# Patient Record
Sex: Female | Born: 1990 | Race: White | Hispanic: No | Marital: Single | Smoking: Current every day smoker
Health system: Southern US, Community
[De-identification: ages and names within clinical notes are randomized; demographics above are authoritative.]

## PROBLEM LIST (undated history)

## (undated) ENCOUNTER — Inpatient Hospital Stay (HOSPITAL_COMMUNITY): Payer: Self-pay

## (undated) ENCOUNTER — Emergency Department (HOSPITAL_COMMUNITY): Payer: Medicaid Other

## (undated) DIAGNOSIS — N200 Calculus of kidney: Secondary | ICD-10-CM

## (undated) DIAGNOSIS — R51 Headache: Secondary | ICD-10-CM

## (undated) DIAGNOSIS — D649 Anemia, unspecified: Secondary | ICD-10-CM

## (undated) DIAGNOSIS — R002 Palpitations: Secondary | ICD-10-CM

## (undated) DIAGNOSIS — I10 Essential (primary) hypertension: Secondary | ICD-10-CM

## (undated) HISTORY — PX: WISDOM TOOTH EXTRACTION: SHX21

---
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
## 2008-03-07 ENCOUNTER — Emergency Department (HOSPITAL_COMMUNITY): Admission: EM | Admit: 2008-03-07 | Discharge: 2008-03-07 | Payer: Self-pay | Admitting: Emergency Medicine

## 2008-07-07 ENCOUNTER — Emergency Department (HOSPITAL_COMMUNITY): Admission: EM | Admit: 2008-07-07 | Discharge: 2008-07-07 | Payer: Self-pay | Admitting: Emergency Medicine

## 2008-09-20 ENCOUNTER — Emergency Department (HOSPITAL_COMMUNITY): Admission: EM | Admit: 2008-09-20 | Discharge: 2008-09-20 | Payer: Self-pay | Admitting: Family Medicine

## 2008-10-18 ENCOUNTER — Emergency Department (HOSPITAL_COMMUNITY): Admission: EM | Admit: 2008-10-18 | Discharge: 2008-10-18 | Payer: Self-pay | Admitting: Emergency Medicine

## 2009-02-02 ENCOUNTER — Emergency Department (HOSPITAL_COMMUNITY): Admission: EM | Admit: 2009-02-02 | Discharge: 2009-02-02 | Payer: Self-pay | Admitting: Family Medicine

## 2009-03-16 ENCOUNTER — Emergency Department (HOSPITAL_COMMUNITY): Admission: EM | Admit: 2009-03-16 | Discharge: 2009-03-16 | Payer: Self-pay | Admitting: Emergency Medicine

## 2009-03-21 ENCOUNTER — Emergency Department (HOSPITAL_COMMUNITY): Admission: EM | Admit: 2009-03-21 | Discharge: 2009-03-21 | Payer: Self-pay | Admitting: Emergency Medicine

## 2009-05-27 DIAGNOSIS — D649 Anemia, unspecified: Secondary | ICD-10-CM

## 2009-05-27 HISTORY — DX: Anemia, unspecified: D64.9

## 2009-11-19 ENCOUNTER — Inpatient Hospital Stay (HOSPITAL_COMMUNITY): Admission: AD | Admit: 2009-11-19 | Discharge: 2009-11-19 | Payer: Self-pay | Admitting: Obstetrics and Gynecology

## 2009-12-26 ENCOUNTER — Inpatient Hospital Stay (HOSPITAL_COMMUNITY): Admission: AD | Admit: 2009-12-26 | Discharge: 2009-12-26 | Payer: Self-pay | Admitting: Obstetrics and Gynecology

## 2010-01-07 ENCOUNTER — Inpatient Hospital Stay (HOSPITAL_COMMUNITY): Admission: AD | Admit: 2010-01-07 | Discharge: 2010-01-07 | Payer: Self-pay | Admitting: Obstetrics and Gynecology

## 2010-01-12 ENCOUNTER — Inpatient Hospital Stay (HOSPITAL_COMMUNITY): Admission: AD | Admit: 2010-01-12 | Discharge: 2010-01-16 | Payer: Self-pay | Admitting: Obstetrics and Gynecology

## 2010-01-17 ENCOUNTER — Inpatient Hospital Stay (HOSPITAL_COMMUNITY): Admission: AD | Admit: 2010-01-17 | Discharge: 2010-01-17 | Payer: Self-pay | Admitting: Obstetrics and Gynecology

## 2010-08-10 LAB — CBC
HCT: 20.3 % — ABNORMAL LOW (ref 36.0–46.0)
MCH: 29.7 pg (ref 26.0–34.0)
MCH: 31.3 pg (ref 26.0–34.0)
MCHC: 32.9 g/dL (ref 30.0–36.0)
MCHC: 35.3 g/dL (ref 30.0–36.0)
MCV: 90.1 fL (ref 78.0–100.0)
Platelets: 256 10*3/uL (ref 150–400)
RDW: 14.6 % (ref 11.5–15.5)

## 2010-08-10 LAB — RPR: RPR Ser Ql: NONREACTIVE

## 2010-08-10 LAB — RH IMMUNE GLOB WKUP(>/=20WKS)(NOT WOMEN'S HOSP)
Antibody Screen: NEGATIVE
Fetal Screen: NEGATIVE

## 2010-08-30 LAB — URINE MICROSCOPIC-ADD ON

## 2010-08-30 LAB — URINALYSIS, ROUTINE W REFLEX MICROSCOPIC
Bilirubin Urine: NEGATIVE
Glucose, UA: NEGATIVE mg/dL
Hgb urine dipstick: NEGATIVE
Ketones, ur: NEGATIVE mg/dL
Nitrite: NEGATIVE
Nitrite: NEGATIVE
Specific Gravity, Urine: 1.024 (ref 1.005–1.030)
Specific Gravity, Urine: 1.025 (ref 1.005–1.030)
Urobilinogen, UA: 0.2 mg/dL (ref 0.0–1.0)
pH: 5.5 (ref 5.0–8.0)
pH: 8 (ref 5.0–8.0)

## 2010-08-30 LAB — POCT PREGNANCY, URINE
Preg Test, Ur: NEGATIVE
Preg Test, Ur: NEGATIVE

## 2010-09-05 LAB — POCT URINALYSIS DIP (DEVICE)
Ketones, ur: NEGATIVE mg/dL
Protein, ur: NEGATIVE mg/dL
Urobilinogen, UA: 0.2 mg/dL (ref 0.0–1.0)
pH: 6.5 (ref 5.0–8.0)

## 2010-09-05 LAB — POCT PREGNANCY, URINE: Preg Test, Ur: NEGATIVE

## 2010-09-11 LAB — URINALYSIS, ROUTINE W REFLEX MICROSCOPIC
Bilirubin Urine: NEGATIVE
Ketones, ur: NEGATIVE mg/dL
Nitrite: NEGATIVE
Protein, ur: NEGATIVE mg/dL
pH: 7 (ref 5.0–8.0)

## 2010-09-11 LAB — URINE MICROSCOPIC-ADD ON

## 2010-09-11 LAB — POCT PREGNANCY, URINE: Preg Test, Ur: NEGATIVE

## 2011-01-23 ENCOUNTER — Inpatient Hospital Stay (INDEPENDENT_AMBULATORY_CARE_PROVIDER_SITE_OTHER)
Admission: RE | Admit: 2011-01-23 | Discharge: 2011-01-23 | Disposition: A | Discharge status: Home / Self Care | Payer: Self-pay | Source: Ambulatory Visit | Attending: Family Medicine | Admitting: Family Medicine

## 2011-01-23 DIAGNOSIS — J069 Acute upper respiratory infection, unspecified: Secondary | ICD-10-CM

## 2011-02-21 LAB — DIFFERENTIAL
Basophils Absolute: 0
Eosinophils Absolute: 0.1
Eosinophils Relative: 1
Lymphs Abs: 2.2
Neutrophils Relative %: 68

## 2011-02-21 LAB — COMPREHENSIVE METABOLIC PANEL
ALT: 21
AST: 16
CO2: 21
Calcium: 8.4
Chloride: 112
Creatinine, Ser: 0.71
GFR calc Af Amer: >60
GFR calc non Af Amer: >60
Glucose, Bld: 116 — ABNORMAL HIGH
Sodium: 141
Total Bilirubin: 0.5

## 2011-02-21 LAB — CBC
Hemoglobin: 12.4
MCHC: 34.7
MCV: 85.6
RBC: 4.17
WBC: 9.1

## 2011-02-21 LAB — URINE CULTURE: Colony Count: 4000

## 2011-02-21 LAB — URINALYSIS, ROUTINE W REFLEX MICROSCOPIC
Bilirubin Urine: NEGATIVE
Ketones, ur: NEGATIVE
Nitrite: NEGATIVE
Protein, ur: NEGATIVE
Urobilinogen, UA: 0.2

## 2011-02-21 LAB — LIPASE, BLOOD: Lipase: 16

## 2011-02-21 LAB — POCT PREGNANCY, URINE
Operator id: 151321
Preg Test, Ur: NEGATIVE

## 2011-02-25 LAB — POCT URINALYSIS DIP (DEVICE)
Bilirubin Urine: NEGATIVE
Glucose, UA: NEGATIVE
Nitrite: NEGATIVE
Operator id: 235561
Urobilinogen, UA: 0.2

## 2011-02-25 LAB — POCT PREGNANCY, URINE: Preg Test, Ur: NEGATIVE

## 2011-02-25 LAB — URINE CULTURE

## 2011-03-04 LAB — URINE MICROSCOPIC-ADD ON

## 2011-03-04 LAB — URINALYSIS, ROUTINE W REFLEX MICROSCOPIC
Glucose, UA: NEGATIVE
Glucose, UA: NEGATIVE
Ketones, ur: NEGATIVE
Protein, ur: NEGATIVE
Protein, ur: NEGATIVE

## 2011-03-04 LAB — DIFFERENTIAL
Basophils Absolute: 0.1
Basophils Relative: 1
Eosinophils Absolute: 0 — ABNORMAL LOW
Eosinophils Absolute: 0.1 — ABNORMAL LOW
Lymphs Abs: 0.6 — ABNORMAL LOW
Monocytes Absolute: 1.1 — ABNORMAL HIGH
Monocytes Relative: 3
Monocytes Relative: 7
Neutrophils Relative %: 69
Neutrophils Relative %: 91 — ABNORMAL HIGH

## 2011-03-04 LAB — BASIC METABOLIC PANEL
CO2: 23
Chloride: 106
Chloride: 107
Creatinine, Ser: 0.62
GFR calc Af Amer: >60
GFR calc Af Amer: >60
Glucose, Bld: 94
Potassium: 3.6
Sodium: 137

## 2011-03-04 LAB — CBC
HCT: 37.7
Hemoglobin: 13.6
Hemoglobin: 14.7
MCHC: 35
MCV: 86.1
MCV: 86.4
RBC: 4.38
RBC: 4.85
RDW: 11.8
WBC: 10.6 — ABNORMAL HIGH

## 2011-12-21 ENCOUNTER — Emergency Department (HOSPITAL_COMMUNITY)
Admission: EM | Admit: 2011-12-21 | Discharge: 2011-12-21 | Disposition: A | Discharge status: Home / Self Care | Payer: Medicaid Other | Attending: Emergency Medicine | Admitting: Emergency Medicine

## 2011-12-21 ENCOUNTER — Encounter (HOSPITAL_COMMUNITY): Payer: Self-pay | Admitting: Emergency Medicine

## 2011-12-21 DIAGNOSIS — K0889 Other specified disorders of teeth and supporting structures: Secondary | ICD-10-CM

## 2011-12-21 DIAGNOSIS — K089 Disorder of teeth and supporting structures, unspecified: Secondary | ICD-10-CM | POA: Insufficient documentation

## 2011-12-21 MED ORDER — PENICILLIN V POTASSIUM 500 MG PO TABS: 500.0000 mg | ORAL_TABLET | Freq: Four times a day (QID) | ORAL | Status: AC

## 2011-12-21 NOTE — ED Provider Notes (Signed)
History     CSN: [id number]  Arrival date & time 12/21/11  1514   First MD Initiated Contact with Patient 12/21/11 1537      Chief Complaint  Patient presents with  . Dental Pain    (Consider location/radiation/quality/duration/timing/severity/associated sxs/prior treatment) Patient is a 21 y.o. female presenting with tooth pain. The history is provided by the patient.  Dental Pain G2 P1 7wks preg healthy F presents with dental pain worsening over the last 2 days. Chronically poor dentition, with recurrence of pain that she has experienced in the past to left upper dentition with rad to jaw. Pain constant, gradually worsening. Denies HA, ear pain, tinnitus, facial swelling, trouble swallowing, neck pain/swelling. No CP or SOB. Worse with chewing, opening mouth. No alleviating factors. Has taken nothing for the pain as she was unsure what would be safe while pregnant.  History reviewed. No pertinent past medical history.  Past Surgical History  Procedure Date  . Cesarean section     No family history on file.  History  Substance Use Topics  . Smoking status: Current Everyday Smoker -- 0.5 packs/day  . Smokeless tobacco: Never Used  . Alcohol Use: No     Review of Systems Pertinent positives and negatives are reviewed in the HPI.  Allergies  Review of patient's allergies indicates not on file.  Home Medications  No current outpatient prescriptions on file.  BP 135/72  Pulse 80  Temp 98.6 F (37 C) (Oral)  SpO2 99%  LMP 10/22/2011  Physical Exam  Nursing note reviewed. Constitutional: She is oriented to person, place, and time. She appears well-developed and well-nourished. No distress.       Vital signs are reviewed and are normal. Specifically, pt is afebrile.  HENT:  Head: Normocephalic and atraumatic. No trismus in the jaw.  Right Ear: External ear normal.  Left Ear: External ear normal.  Nose: Nose normal.  Mouth/Throat: Uvula is midline, oropharynx is  clear and moist and mucous membranes are normal. Dental caries present. No uvula swelling. No oropharyngeal exudate or tonsillar abscesses.         No facial swelling appreciated. No TTP floor of mouth. Normal spoken voice.   Eyes: Conjunctivae are normal. Pupils are equal, round, and reactive to light.  Neck: Neck supple.  Cardiovascular: Normal rate and regular rhythm.   Pulmonary/Chest: Effort normal. No respiratory distress.  Musculoskeletal: She exhibits no edema.  Lymphadenopathy:    She has no cervical adenopathy.  Neurological: She is alert and oriented to person, place, and time.  Skin: Skin is warm and dry.  Psychiatric: She has a normal mood and affect.    ED Course  Procedures (including critical care time)  Labs Reviewed - No data to display No results found.   1. Pain, dental       MDM  Dental pain, pregnant. Afebrile, non-toxic appearing. No abscess visible for ED drainage. No sign of ludwig angina, retropharyngeal abscess, peritonsillar abscess. Will start on PCN and advise acetaminophen for pain. She has plans to see a dentist on Monday.        Shaaron Adler, [place] 12/21/11 1610

## 2011-12-21 NOTE — ED Notes (Signed)
Dental pain left upper back tooth radiating down to lower jaw as well. Pain has escalated past 2 days.

## 2011-12-22 NOTE — ED Provider Notes (Signed)
Medical screening examination/treatment/procedure(s) were performed by non-physician practitioner and as supervising physician I was immediately available for consultation/collaboration.  Derwood Kaplan, MD 12/22/11 3086

## 2012-01-02 LAB — OB RESULTS CONSOLE ABO/RH: RH Type: NEGATIVE

## 2012-07-01 ENCOUNTER — Encounter (HOSPITAL_COMMUNITY): Payer: Self-pay

## 2012-07-01 ENCOUNTER — Inpatient Hospital Stay (HOSPITAL_COMMUNITY)
Admission: AD | Admit: 2012-07-01 | Discharge: 2012-07-01 | Disposition: A | Discharge status: Home / Self Care | Payer: Medicaid Other | Source: Ambulatory Visit | Attending: Obstetrics and Gynecology | Admitting: Obstetrics and Gynecology

## 2012-07-01 DIAGNOSIS — R42 Dizziness and giddiness: Secondary | ICD-10-CM | POA: Insufficient documentation

## 2012-07-01 DIAGNOSIS — O99891 Other specified diseases and conditions complicating pregnancy: Secondary | ICD-10-CM | POA: Insufficient documentation

## 2012-07-01 DIAGNOSIS — O219 Vomiting of pregnancy, unspecified: Secondary | ICD-10-CM

## 2012-07-01 DIAGNOSIS — O212 Late vomiting of pregnancy: Secondary | ICD-10-CM | POA: Insufficient documentation

## 2012-07-01 DIAGNOSIS — R197 Diarrhea, unspecified: Secondary | ICD-10-CM

## 2012-07-01 HISTORY — DX: Headache: R51

## 2012-07-01 HISTORY — DX: Anemia, unspecified: D64.9

## 2012-07-01 LAB — URINALYSIS, ROUTINE W REFLEX MICROSCOPIC
Bilirubin Urine: NEGATIVE
Glucose, UA: NEGATIVE mg/dL
Hgb urine dipstick: NEGATIVE
Ketones, ur: NEGATIVE mg/dL
Leukocytes, UA: NEGATIVE
Nitrite: NEGATIVE
Protein, ur: NEGATIVE mg/dL
Specific Gravity, Urine: <1.005 — ABNORMAL LOW (ref 1.005–1.030)
Urobilinogen, UA: 0.2 mg/dL (ref 0.0–1.0)
pH: 7 (ref 5.0–8.0)

## 2012-07-01 LAB — COMPREHENSIVE METABOLIC PANEL
Albumin: 3 g/dL — ABNORMAL LOW (ref 3.5–5.2)
Alkaline Phosphatase: 105 U/L (ref 39–117)
BUN: 5 mg/dL — ABNORMAL LOW (ref 6–23)
CO2: 23 mEq/L (ref 19–32)
Chloride: 100 mEq/L (ref 96–112)
Creatinine, Ser: 0.48 mg/dL — ABNORMAL LOW (ref 0.50–1.10)
GFR calc Af Amer: >90 mL/min (ref >90)
GFR calc non Af Amer: >90 mL/min (ref >90)
Glucose, Bld: 74 mg/dL (ref 70–99)
Potassium: 4 mEq/L (ref 3.5–5.1)
Total Bilirubin: 0.1 mg/dL — ABNORMAL LOW (ref 0.3–1.2)

## 2012-07-01 LAB — CBC
HCT: 31.3 % — ABNORMAL LOW (ref 36.0–46.0)
Hemoglobin: 11 g/dL — ABNORMAL LOW (ref 12.0–15.0)
MCV: 85.1 fL (ref 78.0–100.0)
RBC: 3.68 MIL/uL — ABNORMAL LOW (ref 3.87–5.11)
WBC: 13.5 10*3/uL — ABNORMAL HIGH (ref 4.0–10.5)

## 2012-07-01 MED ORDER — ONDANSETRON HCL 4 MG/2ML IJ SOLN: 4.0000 mg | INTRAMUSCULAR | Status: DC

## 2012-07-01 MED ORDER — LACTATED RINGERS IV BOLUS (SEPSIS): 1000.0000 mL | Freq: Once | INTRAVENOUS | Status: DC

## 2012-07-01 NOTE — MAU Note (Signed)
Pt states here for dizziness since early this am, last pm, denies bleeding or lof. +FM, denies uti s/s.

## 2012-07-01 NOTE — MAU Provider Note (Signed)
Chief Complaint:  Emesis, Dizziness, Diarrhea and Anxiety   First Provider Initiated Contact with Patient 07/01/12 1050      HPI: Dorothy Schwartz is a 22 y.o. G2P1001 at 41w3dwho presents to maternity admissions reporting episode this morning of n/v/diarrhea and dizziness.  Pt denies n/v/diarrhea upon arrival to MAU but reports some mild lightheadedness continues. She denies known anemia in this pregnancy but reports she was anemic in her last pregnancy.  She has not eaten anything yet today. She reports good fetal movement, denies cramping/contractions, LOF, vaginal bleeding, vaginal itching/burning, urinary symptoms, h/a, or fever/chills.  .   Past Medical History: Past Medical History  Diagnosis Date  . Anemia   . Headache     Past obstetric history: OB History    Grav Para Term Preterm Abortions TAB SAB Ect Mult Living   2 1 1       1      # Outc Date GA Lbr Len/2nd Wgt Sex Del Anes PTL Lv   1 TRM            2 CUR               Past Surgical History: Past Surgical History  Procedure Date  . Cesarean section   . Wisdom tooth extraction     Family History: Family History  Problem Relation Age of Onset  . Other Neg Hx     Social History: History  Substance Use Topics  . Smoking status: Current Every Day Smoker -- 0.5 packs/day  . Smokeless tobacco: Never Used  . Alcohol Use: No    Allergies:  Allergies  Allergen Reactions  . Morphine And Related Shortness Of Breath  . Nsaids Anaphylaxis    Kidney stones   . Percocet (Oxycodone-Acetaminophen) Hives    Meds:  Prescriptions prior to admission  Medication Sig Dispense Refill  . cyclobenzaprine (FLEXERIL) 10 MG tablet Take 10 mg by mouth 3 (three) times daily as needed. Muscle spasms      . Prenatal Vit-Fe Fumarate-FA (PRENATAL MULTIVITAMIN) TABS Take 1 tablet by mouth daily.        ROS: Pertinent findings in history of present illness.  Physical Exam  Blood pressure 125/70, pulse 89, temperature 98.2 F  (36.8 C), temperature source Oral, resp. rate 18, height 5' 1.5" (1.562 m), weight 84.369 kg (186 lb), last menstrual period 10/22/2011, SpO2 99.00%. GENERAL: Well-developed, well-nourished female in no acute distress.  HEENT: normocephalic HEART: normal rate RESP: normal effort ABDOMEN: Soft, non-tender, gravid appropriate for gestational age EXTREMITIES: Nontender, no edema NEURO: alert and oriented SPECULUM EXAM: Deferred  Dilation: Closed Effacement (%): Thick Cervical Position: Posterior Station: -3 Exam by:: LLeftwich-Kirby, CNM  FHT:  Baseline 120 , moderate variability, accelerations present, no decelerations Contractions: occasional, mild to palpation   Labs:  Recent Results (from the past 168 hour(s))  URINALYSIS, ROUTINE W REFLEX MICROSCOPIC   Collection Time    07/01/12  9:55 AM      Result Value Range   Color, Urine YELLOW  YELLOW   APPearance CLEAR  CLEAR   Specific Gravity, Urine <1.005 (*) 1.005 - 1.030   pH 7.0  5.0 - 8.0   Glucose, UA NEGATIVE  NEGATIVE mg/dL   Hgb urine dipstick NEGATIVE  NEGATIVE   Bilirubin Urine NEGATIVE  NEGATIVE   Ketones, ur NEGATIVE  NEGATIVE mg/dL   Protein, ur NEGATIVE  NEGATIVE mg/dL   Urobilinogen, UA 0.2  0.0 - 1.0 mg/dL   Nitrite NEGATIVE  NEGATIVE  Leukocytes, UA NEGATIVE  NEGATIVE  CBC   Collection Time    07/01/12 11:13 AM      Result Value Range   WBC 13.5 (*) 4.0 - 10.5 K/uL   RBC 3.68 (*) 3.87 - 5.11 MIL/uL   Hemoglobin 11.0 (*) 12.0 - 15.0 g/dL   HCT 16.1 (*) 09.6 - 04.5 %   MCV 85.1  78.0 - 100.0 fL   MCH 29.9  26.0 - 34.0 pg   MCHC 35.1  30.0 - 36.0 g/dL   RDW 40.9  81.1 - 91.4 %   Platelets 264  150 - 400 K/uL  COMPREHENSIVE METABOLIC PANEL   Collection Time    07/01/12 11:13 AM      Result Value Range   Sodium 134 (*) 135 - 145 mEq/L   Potassium 4.0  3.5 - 5.1 mEq/L   Chloride 100  96 - 112 mEq/L   CO2 23  19 - 32 mEq/L   Glucose, Bld 74  70 - 99 mg/dL   BUN 5 (*) 6 - 23 mg/dL   Creatinine, Ser  7.82 (*) 0.50 - 1.10 mg/dL   Calcium 9.3  8.4 - 95.6 mg/dL   Total Protein 6.6  6.0 - 8.3 g/dL   Albumin 3.0 (*) 3.5 - 5.2 g/dL   AST 15  0 - 37 U/L   ALT 10  0 - 35 U/L   Alkaline Phosphatase 105  39 - 117 U/L   Total Bilirubin 0.1 (*) 0.3 - 1.2 mg/dL   GFR calc non Af Amer >90  >90 mL/min   GFR calc Af Amer >90  >90 mL/min     Assessment: 1. Dizziness   2. Diarrhea   3. Nausea/vomiting in pregnancy     Plan: Called Dr Marcelle Overlie to review assessment and findings Discharge home Eat regular meals and drink plenty of fluid Eat iron-rich foods and take PNV daily F/U with prenatal provider Return to MAU as needed     Medication List     As of 07/01/2012 11:06 AM    ASK your doctor about these medications         cyclobenzaprine 10 MG tablet   Commonly known as: FLEXERIL   Take 10 mg by mouth 3 (three) times daily as needed. Muscle spasms      prenatal multivitamin Tabs   Take 1 tablet by mouth daily.        Sharen Counter Certified Nurse-Midwife 07/01/2012 11:06 AM

## 2012-07-01 NOTE — MAU Note (Signed)
Woke up during the night went to the restoom, was dizzy.  When woke up this morning was still feeling dizzy. Vomiting (1) started this morning.  Diarrhea (4) also started this morning. No one else at home is sick. Hands are clammy, is very anxious.

## 2012-07-16 ENCOUNTER — Inpatient Hospital Stay (HOSPITAL_COMMUNITY)
Admission: AD | Admit: 2012-07-16 | Discharge: 2012-07-16 | Disposition: A | Discharge status: Home / Self Care | Payer: Medicaid Other | Source: Ambulatory Visit | Attending: Obstetrics and Gynecology | Admitting: Obstetrics and Gynecology

## 2012-07-16 ENCOUNTER — Inpatient Hospital Stay (HOSPITAL_COMMUNITY): Payer: Medicaid Other

## 2012-07-16 ENCOUNTER — Encounter (HOSPITAL_COMMUNITY): Payer: Self-pay | Admitting: *Deleted

## 2012-07-16 DIAGNOSIS — N390 Urinary tract infection, site not specified: Secondary | ICD-10-CM | POA: Insufficient documentation

## 2012-07-16 DIAGNOSIS — R109 Unspecified abdominal pain: Secondary | ICD-10-CM | POA: Insufficient documentation

## 2012-07-16 DIAGNOSIS — O2390 Unspecified genitourinary tract infection in pregnancy, unspecified trimester: Secondary | ICD-10-CM | POA: Insufficient documentation

## 2012-07-16 DIAGNOSIS — O2340 Unspecified infection of urinary tract in pregnancy, unspecified trimester: Secondary | ICD-10-CM

## 2012-07-16 LAB — URINALYSIS, ROUTINE W REFLEX MICROSCOPIC
Glucose, UA: NEGATIVE mg/dL
Ketones, ur: NEGATIVE mg/dL
Protein, ur: NEGATIVE mg/dL
Urobilinogen, UA: 0.2 mg/dL (ref 0.0–1.0)

## 2012-07-16 LAB — URINE MICROSCOPIC-ADD ON

## 2012-07-16 MED ORDER — GI COCKTAIL ~~LOC~~: 30.0000 mL | Freq: Once | ORAL | Status: DC

## 2012-07-16 MED ORDER — CEPHALEXIN 500 MG PO CAPS
500.0000 mg | ORAL_CAPSULE | Freq: Once | ORAL | Status: AC
  Administered 2012-07-16: 500 mg via ORAL
  Filled 2012-07-16: qty 1

## 2012-07-16 MED ORDER — CEPHALEXIN 500 MG PO CAPS: 500.0000 mg | ORAL_CAPSULE | Freq: Four times a day (QID) | ORAL | Status: DC

## 2012-07-16 NOTE — MAU Note (Signed)
Mayer Camel NP at the bedside.

## 2012-07-16 NOTE — MAU Note (Signed)
Pt reports " a burning sensation" in upper mid abd. States it has been there all day today.

## 2012-07-16 NOTE — MAU Provider Note (Signed)
History     CSN: [id number]  Arrival date and time: 07/16/12 2016   First Provider Initiated Contact with Patient 07/16/12 2158      Chief Complaint  Patient presents with  . Abdominal Pain   HPI Dorothy Schwartz is a 22 y.o. female @ [redacted]w[redacted]d gestation who presents to MAU with abdominal pain. The pain is located in the upper abdomen. She describes the pain as a burning that comes and goes. She has felt some contractions but not strong or painful. She denies nausea, vomiting or other problems. The history was provided by the patient.  OB History   Grav Para Term Preterm Abortions TAB SAB Ect Mult Living   2 1 1       1       Past Medical History  Diagnosis Date  . Anemia   . Headache     Past Surgical History  Procedure Laterality Date  . Cesarean section    . Wisdom tooth extraction      Family History  Problem Relation Age of Onset  . Other Neg Hx     History  Substance Use Topics  . Smoking status: Current Every Day Smoker -- 0.50 packs/day  . Smokeless tobacco: Never Used  . Alcohol Use: No    Allergies:  Allergies  Allergen Reactions  . Morphine And Related Shortness Of Breath  . Percocet (Oxycodone-Acetaminophen) Hives    Prescriptions prior to admission  Medication Sig Dispense Refill  . calcium carbonate (TUMS - DOSED IN MG ELEMENTAL CALCIUM) 500 MG chewable tablet Chew 2 tablets by mouth daily as needed for heartburn.      . cyclobenzaprine (FLEXERIL) 10 MG tablet Take 10 mg by mouth 2 (two) times daily as needed for muscle spasms. Muscle spasms      . Prenatal Vit-Fe Fumarate-FA (PRENATAL MULTIVITAMIN) TABS Take 1 tablet by mouth daily.        Review of Systems  Constitutional: Negative for fever and chills.  Eyes: Negative for blurred vision and double vision.  Respiratory: Negative for cough and wheezing.   Cardiovascular: Negative for chest pain and palpitations.  Gastrointestinal: Positive for abdominal pain. Negative for nausea and vomiting.   Genitourinary: Positive for frequency. Negative for dysuria and urgency.  Musculoskeletal: Negative for myalgias and back pain.  Skin: Negative for rash.  Neurological: Negative for dizziness and headaches.  Psychiatric/Behavioral: Negative for depression. The patient is not nervous/anxious.    Blood pressure 144/70, pulse 88, temperature 98.4 F (36.9 C), temperature source Oral, resp. rate 18, height 5\' 2"  (1.575 m), weight 188 lb (85.276 kg), last menstrual period 10/22/2011, SpO2 100.00%.   Physical Exam  Constitutional: She is oriented to person, place, and time. She appears well-developed and well-nourished. No distress.  HENT:  Head: Normocephalic and atraumatic.  Eyes: EOM are normal.  Neck: Neck supple.  Cardiovascular: Normal rate.   Respiratory: Effort normal.  GI: Soft. There is tenderness.  Mildly tender upper abdomen at fundal ht. No guarding or rebound.  Genitourinary:  No bleeding, Dilation: Closed Effacement (%): Thick Cervical Position: Posterior Station: -3 Presentation: Vertex Exam by:: Rudi Coco RN   Musculoskeletal: Normal range of motion.  Neurological: She is alert and oriented to person, place, and time.  Skin: Skin is warm and dry.  Psychiatric: She has a normal mood and affect. Her behavior is normal. Judgment and thought content normal.   Procedures  EFM: reactive tracing, Baseline 120, contractions q 5 minutes, mild 22:40 discussed with Dr.  Rana Snare and will get limited OB ultrasound  Results for orders placed during the hospital encounter of 07/16/12 (from the past 24 hour(s))  URINALYSIS, ROUTINE W REFLEX MICROSCOPIC     Status: Abnormal   Collection Time    07/16/12  8:27 PM      Result Value Range   Color, Urine YELLOW  YELLOW   APPearance CLEAR  CLEAR   Specific Gravity, Urine <1.005 (*) 1.005 - 1.030   pH 6.5  5.0 - 8.0   Glucose, UA NEGATIVE  NEGATIVE mg/dL   Hgb urine dipstick TRACE (*) NEGATIVE   Bilirubin Urine NEGATIVE  NEGATIVE    Ketones, ur NEGATIVE  NEGATIVE mg/dL   Protein, ur NEGATIVE  NEGATIVE mg/dL   Urobilinogen, UA 0.2  0.0 - 1.0 mg/dL   Nitrite NEGATIVE  NEGATIVE   Leukocytes, UA MODERATE (*) NEGATIVE  URINE MICROSCOPIC-ADD ON     Status: Abnormal   Collection Time    07/16/12  8:27 PM      Result Value Range   Squamous Epithelial / LPF FEW (*) RARE   WBC, UA 7-10  <3 WBC/hpf   RBC / HPF 0-2  <3 RBC/hpf   Bacteria, UA FEW (*) RARE   Urine-Other MUCOUS PRESENT      US Ob Limited  07/16/2012  *RADIOLOGY REPORT*  Clinical Data: Abdominal pain  LIMITED ULTRASOUND OF PELVIS  Comparison:  None.  Findings: Single fetus with heart rate of 121 beats per minute.  Movement documented.  Presentation cephalic.  Placental previa not visualized.  Amniotic fluid subjectively within normal limits.  AFI of 16.4 cm.  Fetal measurements: BPD 9.2 cm, estimated age of 37 weeks 2 days (EDC 08/04/2012)  Uterus/adnexa within normal limits.  IMPRESSION: Intrauterine gestation with cardiac activity and movement documented.  See above.   Original Report Authenticated By: Jearld Lesch, M.D.     Assessment:  22 y.o. female @ [redacted]w[redacted]d with abdominal pain   UTI  Plan:  Treat UTI   Tylenol as needed   Urine sent for culture I have reviewed this patient's vital signs, nurses notes, appropriate labs and imaging.  I have discussed findings with the patient and plan of care. Patient voices understanding.  Kaelob Persky, RN, FNP, [facility] 07/16/2012, 10:04 PM

## 2012-07-18 LAB — URINE CULTURE: Colony Count: NO GROWTH

## 2012-07-21 NOTE — H&P (Signed)
DARETH ANDREW  DICTATION #[id number]  CSN# [id number]   Meriel Pica, MD 07/21/2012 8:44 AM

## 2012-07-22 NOTE — H&P (Signed)
Dorothy Schwartz, Dorothy Schwartz                 ACCOUNT NO.:  [id number]  MEDICAL RECORD NO.:  [id number]  LOCATION:                                 FACILITY:  PHYSICIAN:  Duke Salvia. Marcelle Overlie, M.D.DATE OF BIRTH:  March 11, 1991  DATE OF ADMISSION: DATE OF DISCHARGE:                             HISTORY & PHYSICAL   CHIEF COMPLAINT:  For repeat cesarean section and tubal ligation at term.  HISTORY OF PRESENT ILLNESS:  A 22 year old, G2, P1-0-0-1, EDD August 09, 2012, previous cesarean section.  She has had an uncomplicated pregnancy, presents at this time for repeat cesarean section at term with tubal ligation.  This procedure including specific risks related to bleeding, infection, transfusion, wound infection, phlebitis along with her expected recovery time discussed.  The permanence of the tubal procedure and failure rate of 2-07/998 reviewed.  She is Rh negative. She did receive RhoGAM at 28 weeks.  One-hour GTT was normal at 124.  PAST MEDICAL HISTORY:  ALLERGIES:  Percocet and morphine.  PREVIOUS SURGERY:  In 2011, primary cesarean section for arrest of dilatation.  Please see the [facility] form for the remainder of family, social history, and review of systems.  PHYSICAL EXAMINATION:  VITAL SIGNS:  Temp 98.2, blood pressure 130/82. HEENT:  Unremarkable. NECK:  Supple without masses. LUNGS:  Clear. CARDIOVASCULAR:  Regular rate and rhythm without murmurs, rubs, gallops. BREASTS:  Without masses term fundal height.  Fetal heart rate 140. Cervix was closed. EXTREMITIES:  Unremarkable. NEUROLOGIC:  Unremarkable.  IMPRESSION:  Term pregnancy, previous cesarean section for repeat cesarean section and tubal ligation.  Procedure and risks discussed as above.    Azjah Pardo M. Marcelle Overlie, M.D.    [facility]/MEDQ  D:  07/21/2012  T:  07/21/2012  Job:  [id number]

## 2012-08-12 ENCOUNTER — Encounter (HOSPITAL_COMMUNITY): Payer: Self-pay

## 2012-08-12 ENCOUNTER — Encounter (HOSPITAL_COMMUNITY)
Admission: RE | Admit: 2012-08-12 | Discharge: 2012-08-12 | Disposition: A | Discharge status: Home / Self Care | Payer: Medicaid Other | Source: Ambulatory Visit | Attending: Obstetrics and Gynecology | Admitting: Obstetrics and Gynecology

## 2012-08-12 HISTORY — DX: Calculus of kidney: N20.0

## 2012-08-12 HISTORY — DX: Palpitations: R00.2

## 2012-08-12 LAB — CBC
Hemoglobin: 11.3 g/dL — ABNORMAL LOW (ref 12.0–15.0)
MCH: 29 pg (ref 26.0–34.0)
RBC: 3.9 MIL/uL (ref 3.87–5.11)

## 2012-08-12 LAB — ABO/RH: ABO/RH(D): A NEG

## 2012-08-12 LAB — TYPE AND SCREEN
ABO/RH(D): A NEG
Antibody Screen: NEGATIVE

## 2012-08-12 LAB — RPR: RPR Ser Ql: NONREACTIVE

## 2012-08-12 NOTE — Patient Instructions (Addendum)
Your procedure is scheduled on:08/13/12  Enter through the Main Entrance at : 1130 am Pick up desk phone and dial [phone] and inform us of your arrival.  Please call [phone] if you have any problems the morning of surgery.  Remember: Do not eat after midnight:tonight. Clear liquids ok until 9am Thursday   Take these meds the morning of surgery with a sip of water: none  DO NOT wear jewelry, eye make-up, lipstick,body lotion, or dark fingernail polish. Do not shave for 48 hours prior to surgery.  If you are to be admitted after surgery, leave suitcase in car until your room has been assigned. Patients discharged on the day of surgery will not be allowed to drive home.

## 2012-08-13 ENCOUNTER — Encounter (HOSPITAL_COMMUNITY)
Admission: RE | Disposition: A | Discharge status: Home / Self Care | Payer: Self-pay | Source: Ambulatory Visit | Attending: Obstetrics and Gynecology

## 2012-08-13 ENCOUNTER — Inpatient Hospital Stay (HOSPITAL_COMMUNITY): Payer: Medicaid Other | Admitting: Anesthesiology

## 2012-08-13 ENCOUNTER — Encounter (HOSPITAL_COMMUNITY): Payer: Self-pay | Admitting: Anesthesiology

## 2012-08-13 ENCOUNTER — Inpatient Hospital Stay (HOSPITAL_COMMUNITY)
Admission: RE | Admit: 2012-08-13 | Discharge: 2012-08-15 | DRG: 766 | Disposition: A | Discharge status: Home / Self Care | Payer: Medicaid Other | Source: Ambulatory Visit | Attending: Obstetrics and Gynecology | Admitting: Obstetrics and Gynecology

## 2012-08-13 DIAGNOSIS — Z302 Encounter for sterilization: Secondary | ICD-10-CM

## 2012-08-13 DIAGNOSIS — O34219 Maternal care for unspecified type scar from previous cesarean delivery: Principal | ICD-10-CM | POA: Diagnosis present

## 2012-08-13 SURGERY — Surgical Case
Anesthesia: Spinal | Laterality: Bilateral | Wound class: Clean Contaminated

## 2012-08-13 MED ORDER — MEPERIDINE HCL 25 MG/ML IJ SOLN
INTRAMUSCULAR | Status: DC | PRN
  Administered 2012-08-13: 25 mg via INTRAVENOUS

## 2012-08-13 MED ORDER — HYDROMORPHONE HCL PF 1 MG/ML IJ SOLN
INTRAMUSCULAR | Status: AC
  Filled 2012-08-13: qty 1

## 2012-08-13 MED ORDER — NALOXONE HCL 0.4 MG/ML IJ SOLN: 0.4000 mg | INTRAMUSCULAR | Status: DC | PRN

## 2012-08-13 MED ORDER — LACTATED RINGERS IV SOLN
INTRAVENOUS | Status: DC | PRN
  Administered 2012-08-13: 13:00:00 via INTRAVENOUS

## 2012-08-13 MED ORDER — FENTANYL CITRATE 0.05 MG/ML IJ SOLN
INTRAMUSCULAR | Status: AC
  Filled 2012-08-13: qty 2

## 2012-08-13 MED ORDER — ONDANSETRON HCL 4 MG/2ML IJ SOLN: 4.0000 mg | Freq: Four times a day (QID) | INTRAMUSCULAR | Status: DC | PRN

## 2012-08-13 MED ORDER — MENTHOL 3 MG MT LOZG: 1.0000 | LOZENGE | OROMUCOSAL | Status: DC | PRN

## 2012-08-13 MED ORDER — MEPERIDINE HCL 25 MG/ML IJ SOLN
INTRAMUSCULAR | Status: AC
  Filled 2012-08-13: qty 1

## 2012-08-13 MED ORDER — ONDANSETRON HCL 4 MG/2ML IJ SOLN
INTRAMUSCULAR | Status: AC
  Filled 2012-08-13: qty 2

## 2012-08-13 MED ORDER — OXYTOCIN 40 UNITS IN LACTATED RINGERS INFUSION - SIMPLE MED: 62.5000 mL/h | INTRAVENOUS | Status: AC

## 2012-08-13 MED ORDER — NALOXONE HCL 1 MG/ML IJ SOLN: 1.0000 ug/kg/h | INTRAVENOUS | Status: DC | PRN

## 2012-08-13 MED ORDER — CEFAZOLIN SODIUM-DEXTROSE 2-3 GM-% IV SOLR
2.0000 g | INTRAVENOUS | Status: AC
  Administered 2012-08-13: 2 g via INTRAVENOUS
  Filled 2012-08-13: qty 50

## 2012-08-13 MED ORDER — ONDANSETRON HCL 4 MG/2ML IJ SOLN
INTRAMUSCULAR | Status: DC | PRN
  Administered 2012-08-13: 4 mg via INTRAVENOUS

## 2012-08-13 MED ORDER — OXYTOCIN 10 UNIT/ML IJ SOLN
INTRAMUSCULAR | Status: AC
  Filled 2012-08-13: qty 4

## 2012-08-13 MED ORDER — PHENYLEPHRINE 40 MCG/ML (10ML) SYRINGE FOR IV PUSH (FOR BLOOD PRESSURE SUPPORT)
PREFILLED_SYRINGE | INTRAVENOUS | Status: AC
  Filled 2012-08-13: qty 5

## 2012-08-13 MED ORDER — SODIUM CHLORIDE 0.9 % IV SOLN: 250.0000 mL | INTRAVENOUS | Status: DC

## 2012-08-13 MED ORDER — 0.9 % SODIUM CHLORIDE (POUR BTL) OPTIME
TOPICAL | Status: DC | PRN
  Administered 2012-08-13: 1000 mL

## 2012-08-13 MED ORDER — SODIUM CHLORIDE 0.9 % IJ SOLN: 3.0000 mL | INTRAMUSCULAR | Status: DC | PRN

## 2012-08-13 MED ORDER — KETOROLAC TROMETHAMINE 60 MG/2ML IM SOLN: 60.0000 mg | Freq: Once | INTRAMUSCULAR | Status: AC | PRN

## 2012-08-13 MED ORDER — ZOLPIDEM TARTRATE 5 MG PO TABS: 5.0000 mg | ORAL_TABLET | Freq: Every evening | ORAL | Status: DC | PRN

## 2012-08-13 MED ORDER — SODIUM CHLORIDE 0.9 % IJ SOLN: 9.0000 mL | INTRAMUSCULAR | Status: DC | PRN

## 2012-08-13 MED ORDER — DIPHENHYDRAMINE HCL 50 MG/ML IJ SOLN: 12.5000 mg | INTRAMUSCULAR | Status: DC | PRN

## 2012-08-13 MED ORDER — IBUPROFEN 800 MG PO TABS
800.0000 mg | ORAL_TABLET | Freq: Three times a day (TID) | ORAL | Status: DC | PRN
  Administered 2012-08-13 – 2012-08-15 (×4): 800 mg via ORAL
  Filled 2012-08-13 (×4): qty 1

## 2012-08-13 MED ORDER — HYDROMORPHONE 0.3 MG/ML IV SOLN: INTRAVENOUS | Status: DC

## 2012-08-13 MED ORDER — DIPHENHYDRAMINE HCL 25 MG PO CAPS: 25.0000 mg | ORAL_CAPSULE | ORAL | Status: DC | PRN

## 2012-08-13 MED ORDER — SCOPOLAMINE 1 MG/3DAYS TD PT72: 1.0000 | MEDICATED_PATCH | Freq: Once | TRANSDERMAL | Status: DC

## 2012-08-13 MED ORDER — PHENYLEPHRINE HCL 10 MG/ML IJ SOLN
INTRAMUSCULAR | Status: DC | PRN
  Administered 2012-08-13 (×7): 40 ug via INTRAVENOUS

## 2012-08-13 MED ORDER — WITCH HAZEL-GLYCERIN EX PADS: 1.0000 "application " | MEDICATED_PAD | CUTANEOUS | Status: DC | PRN

## 2012-08-13 MED ORDER — MEASLES, MUMPS & RUBELLA VAC ~~LOC~~ INJ: 0.5000 mL | INJECTION | Freq: Once | SUBCUTANEOUS | Status: DC

## 2012-08-13 MED ORDER — HYDROMORPHONE HCL PF 1 MG/ML IJ SOLN
INTRAMUSCULAR | Status: AC
  Administered 2012-08-13: 0.5 mg via INTRAVENOUS
  Filled 2012-08-13: qty 1

## 2012-08-13 MED ORDER — EPHEDRINE 5 MG/ML INJ
INTRAVENOUS | Status: AC
  Filled 2012-08-13: qty 20

## 2012-08-13 MED ORDER — EPHEDRINE 5 MG/ML INJ
INTRAVENOUS | Status: AC
  Filled 2012-08-13: qty 10

## 2012-08-13 MED ORDER — METOCLOPRAMIDE HCL 5 MG/ML IJ SOLN: 10.0000 mg | Freq: Three times a day (TID) | INTRAMUSCULAR | Status: DC | PRN

## 2012-08-13 MED ORDER — KETOROLAC TROMETHAMINE 30 MG/ML IJ SOLN: 30.0000 mg | Freq: Four times a day (QID) | INTRAMUSCULAR | Status: AC | PRN

## 2012-08-13 MED ORDER — DIPHENHYDRAMINE HCL 25 MG PO CAPS: 25.0000 mg | ORAL_CAPSULE | Freq: Four times a day (QID) | ORAL | Status: DC | PRN

## 2012-08-13 MED ORDER — DIPHENHYDRAMINE HCL 12.5 MG/5ML PO ELIX
12.5000 mg | ORAL_SOLUTION | Freq: Four times a day (QID) | ORAL | Status: DC | PRN
  Filled 2012-08-13: qty 5

## 2012-08-13 MED ORDER — IBUPROFEN 600 MG PO TABS: 600.0000 mg | ORAL_TABLET | Freq: Four times a day (QID) | ORAL | Status: DC | PRN

## 2012-08-13 MED ORDER — DIBUCAINE 1 % RE OINT: 1.0000 "application " | TOPICAL_OINTMENT | RECTAL | Status: DC | PRN

## 2012-08-13 MED ORDER — SIMETHICONE 80 MG PO CHEW: 80.0000 mg | CHEWABLE_TABLET | ORAL | Status: DC | PRN

## 2012-08-13 MED ORDER — OXYTOCIN 10 UNIT/ML IJ SOLN
40.0000 [IU] | INTRAVENOUS | Status: DC | PRN
  Administered 2012-08-13: 40 [IU] via INTRAVENOUS

## 2012-08-13 MED ORDER — EPHEDRINE SULFATE 50 MG/ML IJ SOLN
INTRAMUSCULAR | Status: DC | PRN
  Administered 2012-08-13 (×6): 10 mg via INTRAVENOUS
  Administered 2012-08-13: 5 mg via INTRAVENOUS

## 2012-08-13 MED ORDER — ONDANSETRON HCL 4 MG PO TABS
4.0000 mg | ORAL_TABLET | Freq: Three times a day (TID) | ORAL | Status: DC | PRN
  Administered 2012-08-13: 4 mg via ORAL
  Filled 2012-08-13: qty 1

## 2012-08-13 MED ORDER — CEFAZOLIN SODIUM-DEXTROSE 2-3 GM-% IV SOLR
INTRAVENOUS | Status: AC
  Filled 2012-08-13: qty 50

## 2012-08-13 MED ORDER — BISACODYL 10 MG RE SUPP: 10.0000 mg | Freq: Every day | RECTAL | Status: DC | PRN

## 2012-08-13 MED ORDER — PRENATAL MULTIVITAMIN CH
1.0000 | ORAL_TABLET | Freq: Every day | ORAL | Status: DC
  Administered 2012-08-14 – 2012-08-15 (×2): 1 via ORAL
  Filled 2012-08-13 (×2): qty 1

## 2012-08-13 MED ORDER — NALBUPHINE HCL 10 MG/ML IJ SOLN: 5.0000 mg | INTRAMUSCULAR | Status: DC | PRN

## 2012-08-13 MED ORDER — MEPERIDINE HCL 25 MG/ML IJ SOLN: 6.2500 mg | INTRAMUSCULAR | Status: DC | PRN

## 2012-08-13 MED ORDER — TETANUS-DIPHTH-ACELL PERTUSSIS 5-2.5-18.5 LF-MCG/0.5 IM SUSP
0.5000 mL | Freq: Once | INTRAMUSCULAR | Status: AC
  Administered 2012-08-14: 0.5 mL via INTRAMUSCULAR
  Filled 2012-08-13: qty 0.5

## 2012-08-13 MED ORDER — HYDROCODONE-ACETAMINOPHEN 5-325 MG PO TABS
1.0000 | ORAL_TABLET | ORAL | Status: DC | PRN
  Administered 2012-08-13 – 2012-08-14 (×2): 2 via ORAL
  Administered 2012-08-14: 1 via ORAL
  Administered 2012-08-14 (×2): 2 via ORAL
  Administered 2012-08-15 (×2): 1 via ORAL
  Administered 2012-08-15: 2 via ORAL
  Administered 2012-08-15: 1 via ORAL
  Filled 2012-08-13: qty 2
  Filled 2012-08-13: qty 1
  Filled 2012-08-13: qty 2
  Filled 2012-08-13 (×2): qty 1
  Filled 2012-08-13 (×4): qty 2

## 2012-08-13 MED ORDER — SIMETHICONE 80 MG PO CHEW
80.0000 mg | CHEWABLE_TABLET | Freq: Three times a day (TID) | ORAL | Status: DC
  Administered 2012-08-14 – 2012-08-15 (×4): 80 mg via ORAL

## 2012-08-13 MED ORDER — MORPHINE SULFATE 0.5 MG/ML IJ SOLN
INTRAMUSCULAR | Status: AC
  Filled 2012-08-13: qty 10

## 2012-08-13 MED ORDER — HYDROMORPHONE HCL PF 1 MG/ML IJ SOLN
0.2500 mg | INTRAMUSCULAR | Status: DC | PRN
  Administered 2012-08-13 (×2): 0.5 mg via INTRAVENOUS

## 2012-08-13 MED ORDER — LACTATED RINGERS IV SOLN
Freq: Once | INTRAVENOUS | Status: AC
  Administered 2012-08-13: 12:00:00 via INTRAVENOUS

## 2012-08-13 MED ORDER — SCOPOLAMINE 1 MG/3DAYS TD PT72
MEDICATED_PATCH | TRANSDERMAL | Status: AC
  Administered 2012-08-13: 1.5 mg via TRANSDERMAL
  Filled 2012-08-13: qty 1

## 2012-08-13 MED ORDER — SENNOSIDES-DOCUSATE SODIUM 8.6-50 MG PO TABS
2.0000 | ORAL_TABLET | Freq: Every day | ORAL | Status: DC
  Administered 2012-08-14: 2 via ORAL

## 2012-08-13 MED ORDER — FLEET ENEMA 7-19 GM/118ML RE ENEM: 1.0000 | ENEMA | Freq: Every day | RECTAL | Status: DC | PRN

## 2012-08-13 MED ORDER — HYDROMORPHONE 0.3 MG/ML IV SOLN
INTRAVENOUS | Status: DC
  Administered 2012-08-13: 17:00:00 via INTRAVENOUS
  Filled 2012-08-13: qty 25

## 2012-08-13 MED ORDER — SODIUM CHLORIDE 0.9 % IJ SOLN: 3.0000 mL | Freq: Two times a day (BID) | INTRAMUSCULAR | Status: DC

## 2012-08-13 MED ORDER — LACTATED RINGERS IV SOLN
INTRAVENOUS | Status: DC | PRN
  Administered 2012-08-13 (×2): via INTRAVENOUS

## 2012-08-13 MED ORDER — DIPHENHYDRAMINE HCL 50 MG/ML IJ SOLN: 25.0000 mg | INTRAMUSCULAR | Status: DC | PRN

## 2012-08-13 MED ORDER — ONDANSETRON HCL 4 MG/2ML IJ SOLN: 4.0000 mg | Freq: Three times a day (TID) | INTRAMUSCULAR | Status: DC | PRN

## 2012-08-13 MED ORDER — LANOLIN HYDROUS EX OINT: 1.0000 "application " | TOPICAL_OINTMENT | CUTANEOUS | Status: DC | PRN

## 2012-08-13 MED ORDER — DIPHENHYDRAMINE HCL 50 MG/ML IJ SOLN: 12.5000 mg | Freq: Four times a day (QID) | INTRAMUSCULAR | Status: DC | PRN

## 2012-08-13 MED ORDER — HYDROMORPHONE HCL 2 MG PO TABS
1.0000 mg | ORAL_TABLET | ORAL | Status: DC | PRN
  Administered 2012-08-13 – 2012-08-14 (×2): 1 mg via ORAL
  Filled 2012-08-13 (×3): qty 1

## 2012-08-13 SURGICAL SUPPLY — 32 items
APL SKNCLS STERI-STRIP NONHPOA (GAUZE/BANDAGES/DRESSINGS) ×1
BENZOIN TINCTURE PRP APPL 2/3 (GAUZE/BANDAGES/DRESSINGS) ×1 IMPLANT
CLIP FILSHIE TUBAL LIGA STRL (Clip) ×1 IMPLANT
CLOTH BEACON ORANGE TIMEOUT ST (SAFETY) ×2 IMPLANT
DRAPE LG THREE QUARTER DISP (DRAPES) ×2 IMPLANT
DRESSING TELFA 8X3 (GAUZE/BANDAGES/DRESSINGS) ×1 IMPLANT
DRSG OPSITE POSTOP 4X10 (GAUZE/BANDAGES/DRESSINGS) ×2 IMPLANT
DURAPREP 26ML APPLICATOR (WOUND CARE) ×2 IMPLANT
ELECT REM PT RETURN 9FT ADLT (ELECTROSURGICAL) ×2
ELECTRODE REM PT RTRN 9FT ADLT (ELECTROSURGICAL) ×1 IMPLANT
EXTRACTOR VACUUM M CUP 4 TUBE (SUCTIONS) IMPLANT
GAUZE SPONGE 4X4 12PLY STRL LF (GAUZE/BANDAGES/DRESSINGS) ×1 IMPLANT
GLOVE BIO SURGEON STRL SZ7 (GLOVE) ×2 IMPLANT
GOWN STRL REIN XL XLG (GOWN DISPOSABLE) ×4 IMPLANT
KIT ABG SYR 3ML LUER SLIP (SYRINGE) IMPLANT
NDL HYPO 25X5/8 SAFETYGLIDE (NEEDLE) ×1 IMPLANT
NEEDLE HYPO 25X5/8 SAFETYGLIDE (NEEDLE) ×2 IMPLANT
NS IRRIG 1000ML POUR BTL (IV SOLUTION) ×2 IMPLANT
PACK C SECTION WH (CUSTOM PROCEDURE TRAY) ×2 IMPLANT
PAD ABD 7.5X8 STRL (GAUZE/BANDAGES/DRESSINGS) ×1 IMPLANT
PAD OB MATERNITY 4.3X12.25 (PERSONAL CARE ITEMS) ×2 IMPLANT
SLEEVE SCD COMPRESS KNEE MED (MISCELLANEOUS) IMPLANT
STRIP CLOSURE SKIN 1/2X4 (GAUZE/BANDAGES/DRESSINGS) ×2 IMPLANT
SUT CHROMIC 0 CTX 36 (SUTURE) ×6 IMPLANT
SUT MON AB 4-0 PS1 27 (SUTURE) ×2 IMPLANT
SUT PDS AB 0 CT1 27 (SUTURE) ×4 IMPLANT
SUT VIC AB 3-0 CT1 27 (SUTURE) ×4
SUT VIC AB 3-0 CT1 TAPERPNT 27 (SUTURE) ×2 IMPLANT
TAPE CLOTH SURG 4X10 WHT LF (GAUZE/BANDAGES/DRESSINGS) ×1 IMPLANT
TOWEL OR 17X24 6PK STRL BLUE (TOWEL DISPOSABLE) ×6 IMPLANT
TRAY FOLEY CATH 14FR (SET/KITS/TRAYS/PACK) ×2 IMPLANT
WATER STERILE IRR 1000ML POUR (IV SOLUTION) ×2 IMPLANT

## 2012-08-13 NOTE — Anesthesia Procedure Notes (Signed)

## 2012-08-13 NOTE — Anesthesia Postprocedure Evaluation (Signed)
  Anesthesia Post-op Note  Patient: Dorothy Schwartz  Procedure(s) Performed: Procedure(s) with comments: CESAREAN SECTION WITH BILATERAL TUBAL LIGATION (Bilateral) - Repeat edc 08/09/12  Patient is awake, responsive, moving her legs, and has signs of resolution of her numbness. Pain and nausea are reasonably well controlled. Vital signs are stable and clinically acceptable. Oxygen saturation is clinically acceptable. There are no apparent anesthetic complications at this time. Patient is ready for discharge.

## 2012-08-13 NOTE — Op Note (Signed)
Preoperative diagnosis: Repeat cesarean section at term, request permanent sterilization  Postoperative diagnosis: Same  Procedure: Repeat low transverse cesarean section, tubal ligation by Filshie clip application  Surgeon: Marcelle Overlie  Anesthesia: Spinal  EBL: 700 cc  Drains: Foley to straight drain  Procedure and findings:  The patient was taken the operating room after an adequate level of spinal anesthetic was obtained with the patient in left tilt position the abdomen prepped and draped in usual fashion for repeat cesarean section. Foley catheter positioned draining clear urine. Appropriate timeout for taken at that point. Transverse incision made tibial scar which is well-healed this is carried down to the fascia which was incised and extended transversely. Rectus muscles divided in the midline, peritoneum entered superiorly without incident and extended in a vertical fashion. The vesicouterine serosa was incised and the bladder was bluntly and sharply dissected below, bladder blade repositioned at that point. Transverse incision made lower segment extended with bandage scissors clear fluid noted the patient delivered of a healthy female Apgars 9 and 9 the infant was suctioned cord clamped and passed the pediatric team for further care. The sent was then delivered manually intact, uterus exteriorized cavity wiped clean with a laparotomy pack closure obtained the first layer of 0 chromic in a locked fashion followed by an imbricating layer of 0 chromic. This is hemostatic bilateral tubes and ovaries were normal the bladder flap area was intact and hemostatic. Filshie clip was applied on the right tube at a 90 angle to 3 cm from the cornu the exact same repeated on the opposite side once the tubal was completed the uterus is then returned its intra-abdominal position. Prior to closure sponge denies precast reported as correct x2. Peritoneum closed the running 2-0 Vicryl suture, 2-0 Vicryl interrupted  sutures used to reapproximate the rectus muscles in the midline. 0 PDS suture was then used from laterally to midline on either side a closed fashion. The subcutaneous tissue was hemostatic and thin to moderate was not sutured separately. 4-0 Monocryl subcuticular closure with Steri-Strips and a pressure dressing she tolerated this well went to recovery room in good condition.  Dictated with [facility]  Dorothy Schwartz M. Milana Obey.D.

## 2012-08-13 NOTE — Anesthesia Preprocedure Evaluation (Addendum)

## 2012-08-13 NOTE — Transfer of Care (Signed)
Immediate Anesthesia Transfer of Care Note  Patient: Dorothy Schwartz  Procedure(s) Performed: Procedure(s) with comments: CESAREAN SECTION WITH BILATERAL TUBAL LIGATION (Bilateral) - Repeat edc 08/09/12  Patient Location: PACU  Anesthesia Type:Spinal  Level of Consciousness: awake, alert , oriented and patient cooperative  Airway & Oxygen Therapy: Patient Spontanous Breathing  Post-op Assessment: Report given to PACU RN and Post -op Vital signs reviewed and stable  Post vital signs: stable  Complications: No apparent anesthesia complications

## 2012-08-13 NOTE — Progress Notes (Signed)
The patient was re-examined with no change in status 

## 2012-08-14 ENCOUNTER — Encounter (HOSPITAL_COMMUNITY): Payer: Self-pay | Admitting: Obstetrics and Gynecology

## 2012-08-14 LAB — BIRTH TISSUE RECOVERY COLLECTION (PLACENTA DONATION)

## 2012-08-14 LAB — CBC
Hemoglobin: 8.8 g/dL — ABNORMAL LOW (ref 12.0–15.0)
RBC: 3.03 MIL/uL — ABNORMAL LOW (ref 3.87–5.11)
WBC: 13 10*3/uL — ABNORMAL HIGH (ref 4.0–10.5)

## 2012-08-14 MED ORDER — RHO D IMMUNE GLOBULIN 1500 UNIT/2ML IJ SOLN
300.0000 ug | Freq: Once | INTRAMUSCULAR | Status: AC
  Administered 2012-08-14: 300 ug via INTRAMUSCULAR
  Filled 2012-08-14: qty 2

## 2012-08-14 NOTE — Progress Notes (Signed)
Subjective: Postpartum Day 1: Cesarean Delivery Patient reports tolerating PO and no problems voiding.    Objective: Vital signs in last 24 hours: Temp:  [97.9 F (36.6 C)-98.7 F (37.1 C)] 97.9 F (36.6 C) (03/21 0340) Pulse Rate:  [68-89] 80 (03/21 0340) Resp:  [16-24] 16 (03/21 0340) BP: (114-134)/(45-72) 128/60 mmHg (03/21 0340) SpO2:  [98 %-100 %] 98 % (03/20 2100) FiO2 (%):  [18 %] 18 % (03/20 1655) Weight:  [197 lb (89.359 kg)] 197 lb (89.359 kg) (03/20 1600)  Physical Exam:  General: alert and cooperative Lochia: appropriate Uterine Fundus: firm Incision: abd pressure dressing noted CDI DVT Evaluation: No evidence of DVT seen on physical exam. Negative Homan's sign. No cords or calf tenderness. No significant calf/ankle edema.   Recent Labs  08/12/12 1230 08/14/12 0610  HGB 11.3* 8.8*  HCT 32.8* 25.7*    Assessment/Plan: Status post Cesarean section. Doing well postoperatively.  Continue current care.  Dickie Cloe G 08/14/2012, 8:33 AM

## 2012-08-15 MED ORDER — HYDROCODONE-ACETAMINOPHEN 5-325 MG PO TABS: 1.0000 | ORAL_TABLET | Freq: Four times a day (QID) | ORAL | Status: DC | PRN

## 2012-08-15 MED ORDER — IBUPROFEN 800 MG PO TABS: 800.0000 mg | ORAL_TABLET | Freq: Four times a day (QID) | ORAL | Status: DC | PRN

## 2012-08-15 NOTE — Discharge Summary (Signed)
Obstetric Discharge Summary Reason for Admission: cesarean section Prenatal Procedures: ultrasound Intrapartum Procedures: cesarean: low cervical, transverse Postpartum Procedures: none Complications-Operative and Postpartum: none Hemoglobin  Date Value Range Status  08/14/2012 8.8* 12.0 - 15.0 g/dL Final     DELTA CHECK NOTED     REPEATED TO VERIFY     HCT  Date Value Range Status  08/14/2012 25.7* 36.0 - 46.0 % Final    Physical Exam:  General: alert, cooperative and no distress Lochia: appropriate Uterine Fundus: firm Incision: healing well DVT Evaluation: No evidence of DVT seen on physical exam.  Discharge Diagnoses: Term Pregnancy-delivered  Discharge Information: Date: 08/15/2012 Activity: pelvic rest Diet: routine Medications: PNV, Ibuprofen and Vicodin Condition: stable Instructions: refer to practice specific booklet Discharge to: home Follow-up Information   Call in 2 weeks to follow up.      Newborn Data: Live born female  Birth Weight: 8 lb 14.5 oz (4040 g) APGAR: 9, 9  Home with mother.  Dorothy Schwartz II,[name] 08/15/2012, 10:20 AM

## 2012-08-15 NOTE — Progress Notes (Signed)
Subjective: Postpartum Day 2: Cesarean Delivery Patient reports tolerating PO, + flatus, + BM and no problems voiding.    Objective: Vital signs in last 24 hours: Temp:  [97.7 F (36.5 C)-98.3 F (36.8 C)] 98.2 F (36.8 C) (03/22 0548) Pulse Rate:  [59-67] 66 (03/22 0548) Resp:  [18] 18 (03/22 0548) BP: (102-130)/(58-65) 105/65 mmHg (03/22 0548) SpO2:  [98 %] 98 % (03/21 1828)  Physical Exam:  General: alert, cooperative and no distress Lochia: appropriate Uterine Fundus: firm Incision: healing well DVT Evaluation: No evidence of DVT seen on physical exam.   Recent Labs  08/12/12 1230 08/14/12 0610  HGB 11.3* 8.8*  HCT 32.8* 25.7*    Assessment/Plan: Status post Cesarean section. Doing well postoperatively.  Discharge home with standard precautions and return to clinic in 4-6 weeks.  Greg Cratty II,Katelynd Blauvelt E 08/15/2012, 10:18 AM

## 2012-08-16 LAB — RH IG WORKUP (INCLUDES ABO/RH)
Fetal Screen: NEGATIVE
Gestational Age(Wks): 40.4

## 2012-08-17 NOTE — Progress Notes (Signed)
Post discharge chart review completed.  

## 2013-07-30 ENCOUNTER — Emergency Department (INDEPENDENT_AMBULATORY_CARE_PROVIDER_SITE_OTHER)
Admission: EM | Admit: 2013-07-30 | Discharge: 2013-07-30 | Disposition: A | Discharge status: Home / Self Care | Payer: Medicaid Other | Source: Home / Self Care

## 2013-07-30 ENCOUNTER — Encounter (HOSPITAL_COMMUNITY): Payer: Self-pay | Admitting: Emergency Medicine

## 2013-07-30 DIAGNOSIS — R059 Cough, unspecified: Secondary | ICD-10-CM

## 2013-07-30 DIAGNOSIS — J329 Chronic sinusitis, unspecified: Secondary | ICD-10-CM

## 2013-07-30 DIAGNOSIS — J988 Other specified respiratory disorders: Secondary | ICD-10-CM

## 2013-07-30 DIAGNOSIS — R05 Cough: Secondary | ICD-10-CM

## 2013-07-30 DIAGNOSIS — R300 Dysuria: Secondary | ICD-10-CM

## 2013-07-30 LAB — POCT URINALYSIS DIP (DEVICE)
Bilirubin Urine: NEGATIVE
Glucose, UA: NEGATIVE mg/dL
Hgb urine dipstick: NEGATIVE
KETONES UR: NEGATIVE mg/dL
LEUKOCYTES UA: NEGATIVE
Nitrite: NEGATIVE
PH: 6.5 (ref 5.0–8.0)
PROTEIN: NEGATIVE mg/dL
Specific Gravity, Urine: 1.025 (ref 1.005–1.030)
UROBILINOGEN UA: 0.2 mg/dL (ref 0.0–1.0)

## 2013-07-30 LAB — POCT RAPID STREP A: Streptococcus, Group A Screen (Direct): NEGATIVE

## 2013-07-30 MED ORDER — AZITHROMYCIN 250 MG PO TABS: 250.0000 mg | ORAL_TABLET | Freq: Every day | ORAL | Status: DC

## 2013-07-30 MED ORDER — BENZONATATE 100 MG PO CAPS: 100.0000 mg | ORAL_CAPSULE | Freq: Three times a day (TID) | ORAL | Status: DC

## 2013-07-30 NOTE — Discharge Instructions (Signed)
Cough, Adult  A cough is a reflex. It helps you clear your throat and airways. A cough can help heal your body. A cough can last 2 or 3 weeks (acute) or may last more than 8 weeks (chronic). Some common causes of a cough can include an infection, allergy, or a cold. HOME CARE  Only take medicine as told by your doctor.  If given, take your medicines (antibiotics) as told. Finish them even if you start to feel better.  Use a cold steam vaporizer or humidier in your home. This can help loosen thick spit (secretions).  Sleep so you are almost sitting up (semi-upright). Use pillows to do this. This helps reduce coughing.  Rest as needed.  Stop smoking if you smoke. GET HELP RIGHT AWAY IF:  You have yellowish-white fluid (pus) in your thick spit.  Your cough gets worse.  Your medicine does not reduce coughing, and you are losing sleep.  You cough up blood.  You have trouble breathing.  Your pain gets worse and medicine does not help.  You have a fever. MAKE SURE YOU:   Understand these instructions.  Will watch your condition.  Will get help right away if you are not doing well or get worse. Document Released: 01/24/2011 Document Revised: 08/05/2011 Document Reviewed: 01/24/2011 [facility] Patient Information 2014 [place].  Sinusitis Sinusitis is redness, soreness, and puffiness (inflammation) of the air pockets in the bones of your face (sinuses). The redness, soreness, and puffiness can cause air and mucus to get trapped in your sinuses. This can allow germs to grow and cause an infection.  HOME CARE   Drink enough fluids to keep your pee (urine) clear or pale yellow.  Use a humidifier in your home.  Run a hot shower to create steam in the bathroom. Sit in the bathroom with the door closed. Breathe in the steam 3 4 times a day.  Put a warm, moist washcloth on your face 3 4 times a day, or as told by your doctor.  Use salt water sprays (saline sprays) to wet the  thick fluid in your nose. This can help the sinuses drain.  Only take medicine as told by your doctor. GET HELP RIGHT AWAY IF:   Your pain gets worse.  You have very bad headaches.  You are sick to your stomach (nauseous).  You throw up (vomit).  You are very sleepy (drowsy) all the time.  Your face is puffy (swollen).  Your vision changes.  You have a stiff neck.  You have trouble breathing. MAKE SURE YOU:   Understand these instructions.  Will watch your condition.  Will get help right away if you are not doing well or get worse. Document Released: 10/30/2007 Document Revised: 02/05/2012 Document Reviewed: 12/17/2011 [facility] Patient Information 2014 [place].   Would use Sudafed over the counter (sign for) for nasal congestion. Finish all antibiotics. Use Delsym as needed for day cough. Motrin 800mg  every 8 hours for sinus pressure and body aches.

## 2013-07-30 NOTE — ED Notes (Signed)
Complains of vaginal discharge with an odor And some vaginal itching

## 2013-07-30 NOTE — ED Notes (Signed)
Complain of sore throat  Tight chest Cough congestion

## 2013-07-30 NOTE — ED Provider Notes (Signed)
CSN: [id number]     Arrival date & time 07/30/13  1819 History   None    Chief Complaint  Patient presents with  . Nasal Congestion   (Consider location/radiation/quality/duration/timing/severity/associated sxs/prior Treatment) HPI Comments: Patient presents with a 6 day history of dry cough, sore throat, sinus congestion and facial pain. No fevers or chills. Mild myalgias. Using OTC cold remedies without relief. Patient also reports a 24 hour history of mild dysuria and thinks she may have an infection. No gross hematuria, back pain, bladder pain or N,V.   The history is provided by the patient.    Past Medical History  Diagnosis Date  . Headache(784.0)   . Anemia 2011    postop anemia after CS  . Palpitations     per pt due to stress  . Kidney stones    Past Surgical History  Procedure Laterality Date  . Cesarean section    . Wisdom tooth extraction    . Cesarean section with bilateral tubal ligation Bilateral 08/13/2012    Procedure: CESAREAN SECTION WITH BILATERAL TUBAL LIGATION;  Surgeon: Margarette Asal, MD;  Location: [facility];  Service: Obstetrics;  Laterality: Bilateral;  Repeat edc 08/09/12   Family History  Problem Relation Age of Onset  . Other Neg Hx    History  Substance Use Topics  . Smoking status: Current Every Day Smoker -- 0.50 packs/day    Types: Cigarettes  . Smokeless tobacco: Never Used  . Alcohol Use: No   OB History   Grav Para Term Preterm Abortions TAB SAB Ect Mult Living   2 2 2       2      Review of Systems  All other systems reviewed and are negative.    Allergies  Morphine and related and Percocet  Home Medications   Current Outpatient Rx  Name  Route  Sig  Dispense  Refill  . azithromycin (ZITHROMAX) 250 MG tablet   Oral   Take 1 tablet (250 mg total) by mouth daily. Take first 2 tablets together, then 1 every day until finished.   6 tablet   0   . benzonatate (TESSALON) 100 MG capsule   Oral   Take 1 capsule (100 mg  total) by mouth every 8 (eight) hours.   21 capsule   0   . cyclobenzaprine (FLEXERIL) 10 MG tablet   Oral   Take 10 mg by mouth daily as needed for muscle spasms. Muscle spasms         . HYDROcodone-acetaminophen (NORCO/VICODIN) 5-325 MG per tablet   Oral   Take 1-2 tablets by mouth every 6 (six) hours as needed.   30 tablet   0   . ibuprofen (ADVIL,MOTRIN) 800 MG tablet   Oral   Take 1 tablet (800 mg total) by mouth every 6 (six) hours as needed.   30 tablet   0   . Prenatal Vit-Fe Fumarate-FA (PRENATAL MULTIVITAMIN) TABS   Oral   Take 1 tablet by mouth daily.          BP 133/81  Pulse 64  Temp(Src) 97.8 F (36.6 C) (Oral)  Resp 16  SpO2 99% Physical Exam  Nursing note and vitals reviewed. Constitutional: She is oriented to person, place, and time. She appears well-developed and well-nourished. No distress.  HENT:  Head: Normocephalic and atraumatic.  Right Ear: External ear normal.  Left Ear: External ear normal.  Mouth/Throat: No oropharyngeal exudate.  Mild oropharynx injection, tenderness to palpation over maxillary  sinus  Eyes: Pupils are equal, round, and reactive to light.  Neck: Normal range of motion. Neck supple.  Cardiovascular: Normal rate, regular rhythm and normal heart sounds.   Pulmonary/Chest: Effort normal. No stridor. No respiratory distress. She has no wheezes.  Scattered rhonci in the right base without wheeze  Abdominal: Soft. There is tenderness.  Neurological: She is alert and oriented to person, place, and time.  Skin: Skin is warm and dry. She is not diaphoretic.  Psychiatric: Her behavior is normal.    ED Course  Procedures (including critical care time) Labs Review Labs Reviewed  POCT URINALYSIS DIP (DEVICE)  POCT RAPID STREP A ([facility] CARE ONLY)   Imaging Review No results found.   MDM   1. Respiratory infection   2. Sinusitis   3. Cough   4. Dysuria   Given duration of symptoms cover with ABX and supportive care.  F/U if worsens No evidence of a UTI-Increase fluids.     Bjorn Pippin, PA-C 07/30/13 1956

## 2013-08-01 LAB — CULTURE, GROUP A STREP

## 2013-08-03 NOTE — ED Provider Notes (Signed)
Medical screening examination/treatment/procedure(s) were performed by a resident physician or non-physician practitioner and as the supervising physician I was immediately available for consultation/collaboration.  Lynne Leader, MD    Gregor Hams, MD 08/03/13 [phone]

## 2013-11-30 ENCOUNTER — Encounter (HOSPITAL_COMMUNITY): Payer: Self-pay | Admitting: *Deleted

## 2013-11-30 ENCOUNTER — Inpatient Hospital Stay (HOSPITAL_COMMUNITY)
Admission: AD | Admit: 2013-11-30 | Discharge: 2013-11-30 | Disposition: A | Discharge status: Home / Self Care | Payer: Medicaid Other | Source: Ambulatory Visit | Attending: Obstetrics & Gynecology | Admitting: Obstetrics & Gynecology

## 2013-11-30 DIAGNOSIS — B9689 Other specified bacterial agents as the cause of diseases classified elsewhere: Secondary | ICD-10-CM | POA: Diagnosis not present

## 2013-11-30 DIAGNOSIS — M545 Low back pain: Secondary | ICD-10-CM

## 2013-11-30 DIAGNOSIS — A499 Bacterial infection, unspecified: Secondary | ICD-10-CM | POA: Insufficient documentation

## 2013-11-30 DIAGNOSIS — R109 Unspecified abdominal pain: Secondary | ICD-10-CM | POA: Insufficient documentation

## 2013-11-30 DIAGNOSIS — Z87442 Personal history of urinary calculi: Secondary | ICD-10-CM | POA: Insufficient documentation

## 2013-11-30 DIAGNOSIS — F17200 Nicotine dependence, unspecified, uncomplicated: Secondary | ICD-10-CM | POA: Diagnosis not present

## 2013-11-30 DIAGNOSIS — N760 Acute vaginitis: Secondary | ICD-10-CM

## 2013-11-30 LAB — WET PREP, GENITAL
Trich, Wet Prep: NONE SEEN
Yeast Wet Prep HPF POC: NONE SEEN

## 2013-11-30 LAB — CBC
HCT: 38.4 % (ref 36.0–46.0)
Hemoglobin: 13.6 g/dL (ref 12.0–15.0)
MCH: 30 pg (ref 26.0–34.0)
MCHC: 35.4 g/dL (ref 30.0–36.0)
MCV: 84.8 fL (ref 78.0–100.0)
PLATELETS: 245 10*3/uL (ref 150–400)
RBC: 4.53 MIL/uL (ref 3.87–5.11)
RDW: 12.3 % (ref 11.5–15.5)
WBC: 7.9 10*3/uL (ref 4.0–10.5)

## 2013-11-30 LAB — COMPREHENSIVE METABOLIC PANEL
ALBUMIN: 4.2 g/dL (ref 3.5–5.2)
ALK PHOS: 57 U/L (ref 39–117)
ALT: 12 U/L (ref 0–35)
AST: 12 U/L (ref 0–37)
Anion gap: 12 (ref 5–15)
BILIRUBIN TOTAL: 0.4 mg/dL (ref 0.3–1.2)
BUN: 9 mg/dL (ref 6–23)
CHLORIDE: 103 meq/L (ref 96–112)
CO2: 26 mEq/L (ref 19–32)
Calcium: 9.6 mg/dL (ref 8.4–10.5)
Creatinine, Ser: 0.73 mg/dL (ref 0.50–1.10)
GFR calc Af Amer: >90 mL/min (ref >90)
GFR calc non Af Amer: >90 mL/min (ref >90)
Glucose, Bld: 84 mg/dL (ref 70–99)
POTASSIUM: 4 meq/L (ref 3.7–5.3)
Sodium: 141 mEq/L (ref 137–147)
Total Protein: 7 g/dL (ref 6.0–8.3)

## 2013-11-30 LAB — URINALYSIS, ROUTINE W REFLEX MICROSCOPIC
Bilirubin Urine: NEGATIVE
Glucose, UA: NEGATIVE mg/dL
HGB URINE DIPSTICK: NEGATIVE
Ketones, ur: NEGATIVE mg/dL
Leukocytes, UA: NEGATIVE
NITRITE: NEGATIVE
PH: 7.5 (ref 5.0–8.0)
Protein, ur: NEGATIVE mg/dL
SPECIFIC GRAVITY, URINE: 1.015 (ref 1.005–1.030)
UROBILINOGEN UA: 0.2 mg/dL (ref 0.0–1.0)

## 2013-11-30 LAB — POCT PREGNANCY, URINE: PREG TEST UR: NEGATIVE

## 2013-11-30 MED ORDER — METRONIDAZOLE 500 MG PO TABS: 500.0000 mg | ORAL_TABLET | Freq: Two times a day (BID) | ORAL | Status: DC

## 2013-11-30 MED ORDER — CYCLOBENZAPRINE HCL 10 MG PO TABS: 10.0000 mg | ORAL_TABLET | Freq: Two times a day (BID) | ORAL | Status: DC | PRN

## 2013-11-30 NOTE — MAU Provider Note (Signed)
History     CSN: [id number]  Arrival date and time: 11/30/13 1136   None     Chief Complaint  Patient presents with  . Abdominal Pain  . Back Pain   The history is provided by the patient.  pt is not pregnant and presents with pain in lower abd and flank pain. Pt has hx of BTL.  Pt has hx of kidney stones. Pt has bilateral lower back pain radiating to her abd.  Pt denies pain with urination, hematuria. constipation or diarrhea.  Pt has not had nausea or vomiting.  Pt has vaginal discharge with odor for about 1 week.  Pt has RCM with LMP about 11/24/2013.  Pt says her pain is fine when she lying still and is not in pain at this time. Pt has had same partner for 8 years.  Pt denies dyspareunia- last had IC 2 days ago.  RN note: Registered Nurse Signed MAU Note Service date: 11/30/2013 11:51 AM   Been having a lot of pain in kidney areas and low abd, had it off and on for wks. Hx tubal in 07/2012. Doesn't have a regular doctor, that is why she came here. Hx of kidney stones.     Past Medical History  Diagnosis Date  . Headache(784.0)   . Anemia 2011    postop anemia after CS  . Palpitations     per pt due to stress  . Kidney stones     Past Surgical History  Procedure Laterality Date  . Cesarean section    . Wisdom tooth extraction    . Cesarean section with bilateral tubal ligation Bilateral 08/13/2012    Procedure: CESAREAN SECTION WITH BILATERAL TUBAL LIGATION;  Surgeon: Margarette Asal, MD;  Location: [facility];  Service: Obstetrics;  Laterality: Bilateral;  Repeat edc 08/09/12    Family History  Problem Relation Age of Onset  . Other Neg Hx     History  Substance Use Topics  . Smoking status: Current Every Day Smoker -- 0.50 packs/day    Types: Cigarettes  . Smokeless tobacco: Never Used  . Alcohol Use: No    Allergies:  Allergies  Allergen Reactions  . Morphine And Related Shortness Of Breath    Pt. States she can tolerate Dilaudid & has had it before.  Marland Kitchen  Percocet [Oxycodone-Acetaminophen] Hives    Prescriptions prior to admission  Medication Sig Dispense Refill  . azithromycin (ZITHROMAX) 250 MG tablet Take 1 tablet (250 mg total) by mouth daily. Take first 2 tablets together, then 1 every day until finished.  6 tablet  0  . benzonatate (TESSALON) 100 MG capsule Take 1 capsule (100 mg total) by mouth every 8 (eight) hours.  21 capsule  0  . cyclobenzaprine (FLEXERIL) 10 MG tablet Take 10 mg by mouth daily as needed for muscle spasms. Muscle spasms      . HYDROcodone-acetaminophen (NORCO/VICODIN) 5-325 MG per tablet Take 1-2 tablets by mouth every 6 (six) hours as needed.  30 tablet  0  . ibuprofen (ADVIL,MOTRIN) 800 MG tablet Take 1 tablet (800 mg total) by mouth every 6 (six) hours as needed.  30 tablet  0  . Prenatal Vit-Fe Fumarate-FA (PRENATAL MULTIVITAMIN) TABS Take 1 tablet by mouth daily.        Review of Systems  Constitutional: Negative for fever and chills.  Gastrointestinal: Positive for abdominal pain. Negative for nausea, vomiting, diarrhea and constipation.  Genitourinary: Negative for dysuria and urgency.  Musculoskeletal: Positive for back  pain.   Physical Exam   Blood pressure 141/81, pulse 67, temperature 98.1 F (36.7 C), temperature source Oral, resp. rate 18, height 5' 1.5" (1.562 m), weight 168 lb 3.2 oz (76.295 kg), last menstrual period 11/24/2013, SpO2 100.00%, unknown if currently breastfeeding.  Physical Exam  Nursing note and vitals reviewed. Constitutional: She is oriented to person, place, and time. She appears well-developed and well-nourished. No distress.  HENT:  Head: Normocephalic.  Eyes: Pupils are equal, round, and reactive to light.  Neck: Normal range of motion. Neck supple.  Cardiovascular: Normal rate.   Respiratory: Effort normal.  No CVA tenderness  GI: Soft. There is tenderness. There is no rebound and no guarding.  Genitourinary:  Small amount of frothy cream colored discharge in vault;  cervix clean, NT, uterus NSSC NT; adnexa without palpable enlargement of tenderness  Musculoskeletal: Normal range of motion.  Neurological: She is alert and oriented to person, place, and time.  Skin: Skin is warm and dry.  Psychiatric: She has a normal mood and affect.    MAU Course  Procedures Results for orders placed during the hospital encounter of 11/30/13 (from the past 24 hour(s))  URINALYSIS, ROUTINE W REFLEX MICROSCOPIC     Status: None   Collection Time    11/30/13 12:00 PM      Result Value Ref Range   Color, Urine YELLOW  YELLOW   APPearance CLEAR  CLEAR   Specific Gravity, Urine 1.015  1.005 - 1.030   pH 7.5  5.0 - 8.0   Glucose, UA NEGATIVE  NEGATIVE mg/dL   Hgb urine dipstick NEGATIVE  NEGATIVE   Bilirubin Urine NEGATIVE  NEGATIVE   Ketones, ur NEGATIVE  NEGATIVE mg/dL   Protein, ur NEGATIVE  NEGATIVE mg/dL   Urobilinogen, UA 0.2  0.0 - 1.0 mg/dL   Nitrite NEGATIVE  NEGATIVE   Leukocytes, UA NEGATIVE  NEGATIVE  POCT PREGNANCY, URINE     Status: None   Collection Time    11/30/13 12:22 PM      Result Value Ref Range   Preg Test, Ur NEGATIVE  NEGATIVE  CBC     Status: None   Collection Time    11/30/13  2:09 PM      Result Value Ref Range   WBC 7.9  4.0 - 10.5 K/uL   RBC 4.53  3.87 - 5.11 MIL/uL   Hemoglobin 13.6  12.0 - 15.0 g/dL   HCT 38.4  36.0 - 46.0 %   MCV 84.8  78.0 - 100.0 fL   MCH 30.0  26.0 - 34.0 pg   MCHC 35.4  30.0 - 36.0 g/dL   RDW 12.3  11.5 - 15.5 %   Platelets 245  150 - 400 K/uL  COMPREHENSIVE METABOLIC PANEL     Status: None   Collection Time    11/30/13  2:09 PM      Result Value Ref Range   Sodium 141  137 - 147 mEq/L   Potassium 4.0  3.7 - 5.3 mEq/L   Chloride 103  96 - 112 mEq/L   CO2 26  19 - 32 mEq/L   Glucose, Bld 84  70 - 99 mg/dL   BUN 9  6 - 23 mg/dL   Creatinine, Ser 0.73  0.50 - 1.10 mg/dL   Calcium 9.6  8.4 - 10.5 mg/dL   Total Protein 7.0  6.0 - 8.3 g/dL   Albumin 4.2  3.5 - 5.2 g/dL   AST 12  0 -  37 U/L   ALT  12  0 - 35 U/L   Alkaline Phosphatase 57  39 - 117 U/L   Total Bilirubin 0.4  0.3 - 1.2 mg/dL   GFR calc non Af Amer >90  >90 mL/min   GFR calc Af Amer >90  >90 mL/min   Anion gap 12  5 - 15  WET PREP, GENITAL     Status: Abnormal   Collection Time    11/30/13  2:51 PM      Result Value Ref Range   Yeast Wet Prep HPF POC NONE SEEN  NONE SEEN   Trich, Wet Prep NONE SEEN  NONE SEEN   Clue Cells Wet Prep HPF POC MANY (*) NONE SEEN   WBC, Wet Prep HPF POC MANY (*) NONE SEEN      Assessment and Plan  abd pain BV- flagyl 500mg  BID for 7 days  Dorothy Schwartz 11/30/2013, 1:58 PM

## 2013-11-30 NOTE — MAU Note (Signed)
Assumed care of patient.

## 2013-11-30 NOTE — MAU Note (Signed)
Been having a lot of pain in kidney areas and low abd, had it off and on for wks.  Hx tubal in  07/2012.  Doesn't have a regular doctor, that is why she came here. Hx of kidney stones.

## 2013-12-01 LAB — GC/CHLAMYDIA PROBE AMP
CT PROBE, AMP APTIMA: NEGATIVE
GC PROBE AMP APTIMA: NEGATIVE

## 2013-12-01 NOTE — MAU Provider Note (Signed)
Attestation of Attending Supervision of Advanced Practitioner (CNM/NP): Evaluation and management procedures were performed by the Advanced Practitioner under my supervision and collaboration. I have reviewed the Advanced Practitioner's note and chart, and I agree with the management and plan.  Raif Chachere H. 11:48 AM

## 2014-03-28 ENCOUNTER — Encounter (HOSPITAL_COMMUNITY): Payer: Self-pay | Admitting: *Deleted

## 2014-06-26 ENCOUNTER — Encounter (HOSPITAL_COMMUNITY): Payer: Self-pay | Admitting: Emergency Medicine

## 2014-06-26 ENCOUNTER — Emergency Department (HOSPITAL_COMMUNITY)
Admission: EM | Admit: 2014-06-26 | Discharge: 2014-06-26 | Disposition: A | Discharge status: Home / Self Care | Payer: Medicaid Other | Attending: Emergency Medicine | Admitting: Emergency Medicine

## 2014-06-26 DIAGNOSIS — Z862 Personal history of diseases of the blood and blood-forming organs and certain disorders involving the immune mechanism: Secondary | ICD-10-CM | POA: Diagnosis not present

## 2014-06-26 DIAGNOSIS — Z79899 Other long term (current) drug therapy: Secondary | ICD-10-CM | POA: Insufficient documentation

## 2014-06-26 DIAGNOSIS — R42 Dizziness and giddiness: Secondary | ICD-10-CM | POA: Diagnosis present

## 2014-06-26 DIAGNOSIS — Z87442 Personal history of urinary calculi: Secondary | ICD-10-CM | POA: Insufficient documentation

## 2014-06-26 DIAGNOSIS — Z720 Tobacco use: Secondary | ICD-10-CM | POA: Diagnosis not present

## 2014-06-26 LAB — BASIC METABOLIC PANEL
Anion gap: 6 (ref 5–15)
BUN: 11 mg/dL (ref 6–23)
CALCIUM: 9.2 mg/dL (ref 8.4–10.5)
CHLORIDE: 106 mmol/L (ref 96–112)
CO2: 27 mmol/L (ref 19–32)
CREATININE: 0.73 mg/dL (ref 0.50–1.10)
GFR calc non Af Amer: >90 mL/min (ref >90)
Glucose, Bld: 86 mg/dL (ref 70–99)
Potassium: 3.4 mmol/L — ABNORMAL LOW (ref 3.5–5.1)
SODIUM: 139 mmol/L (ref 135–145)

## 2014-06-26 LAB — CBC
HCT: 38.3 % (ref 36.0–46.0)
Hemoglobin: 13.6 g/dL (ref 12.0–15.0)
MCH: 30.6 pg (ref 26.0–34.0)
MCHC: 35.5 g/dL (ref 30.0–36.0)
MCV: 86.1 fL (ref 78.0–100.0)
PLATELETS: 276 10*3/uL (ref 150–400)
RBC: 4.45 MIL/uL (ref 3.87–5.11)
RDW: 12.2 % (ref 11.5–15.5)
WBC: 10.3 10*3/uL (ref 4.0–10.5)

## 2014-06-26 NOTE — Discharge Instructions (Signed)

## 2014-06-26 NOTE — ED Notes (Signed)
Per pt, states lightheaded, dizzy for months-woke up this am with symptoms-has not been worked up for in past

## 2014-06-26 NOTE — ED Provider Notes (Signed)
CSN: [id number]     Arrival date & time 06/26/14  1243 History   First MD Initiated Contact with Patient 06/26/14 1503     Chief Complaint  Patient presents with  . Dizziness     (Consider location/radiation/quality/duration/timing/severity/associated sxs/prior Treatment) HPI Comments: Patient here complaining of several years of dizziness which is been worse the last few months. No associated palpitations or chest pain. Thinks her sx maybe anxiety related and does admit to increased stress recently. Denies any syncope or near-syncope. Denies any palpitations. Nothing seems make her symptoms better. Has been using Xanax as needed for them. Denies any black or bloody stools. No heavy periods. No increased symptoms when standing.  Patient is a 24 y.o. female presenting with dizziness. The history is provided by the patient.  Dizziness   Past Medical History  Diagnosis Date  . Headache(784.0)   . Anemia 2011    postop anemia after CS  . Palpitations     per pt due to stress  . Kidney stones    Past Surgical History  Procedure Laterality Date  . Cesarean section    . Wisdom tooth extraction    . Cesarean section with bilateral tubal ligation Bilateral 08/13/2012    Procedure: CESAREAN SECTION WITH BILATERAL TUBAL LIGATION;  Surgeon: Margarette Asal, MD;  Location: [facility];  Service: Obstetrics;  Laterality: Bilateral;  Repeat edc 08/09/12   Family History  Problem Relation Age of Onset  . Other Neg Hx    History  Substance Use Topics  . Smoking status: Current Every Day Smoker -- 0.50 packs/day    Types: Cigarettes  . Smokeless tobacco: Never Used  . Alcohol Use: No   OB History    Gravida Para Term Preterm AB TAB SAB Ectopic Multiple Living   2 2 2       2      Review of Systems  Neurological: Positive for dizziness.  All other systems reviewed and are negative.     Allergies  Morphine and related and Percocet  Home Medications   Prior to Admission medications    Medication Sig Start Date End Date Taking? Authorizing Provider  ALPRAZolam Duanne Moron) 1 MG tablet Take 0.5-1 mg by mouth daily as needed for anxiety.    Yes Historical Provider, MD  cyclobenzaprine (FLEXERIL) 10 MG tablet Take 1 tablet (10 mg total) by mouth 2 (two) times daily as needed for muscle spasms. Patient not taking: Reported on 06/26/2014 11/30/13   West Pugh, NP  metroNIDAZOLE (FLAGYL) 500 MG tablet Take 1 tablet (500 mg total) by mouth 2 (two) times daily. Patient not taking: Reported on 06/26/2014 11/30/13   West Pugh, NP   BP 130/77 mmHg  Pulse 73  Temp(Src) 98.5 F (36.9 C) (Oral)  Resp 16  SpO2 100%  LMP 05/26/2014 Physical Exam  Constitutional: She is oriented to person, place, and time. She appears well-developed and well-nourished.  Non-toxic appearance. No distress.  HENT:  Head: Normocephalic and atraumatic.  Eyes: Conjunctivae, EOM and lids are normal. Pupils are equal, round, and reactive to light.  Neck: Normal range of motion. Neck supple. No tracheal deviation present. No thyroid mass present.  Cardiovascular: Normal rate, regular rhythm and normal heart sounds.  Exam reveals no gallop.   No murmur heard. Pulmonary/Chest: Effort normal and breath sounds normal. No stridor. No respiratory distress. She has no decreased breath sounds. She has no wheezes. She has no rhonchi. She has no rales.  Abdominal: Soft. Normal appearance  and bowel sounds are normal. She exhibits no distension. There is no tenderness. There is no rebound and no CVA tenderness.  Musculoskeletal: Normal range of motion. She exhibits no edema or tenderness.  Neurological: She is alert and oriented to person, place, and time. She has normal strength. No cranial nerve deficit or sensory deficit. GCS eye subscore is 4. GCS verbal subscore is 5. GCS motor subscore is 6.  Skin: Skin is warm and dry. No abrasion and no rash noted.  Psychiatric: She has a normal mood and affect. Her speech is  normal and behavior is normal.  Nursing note and vitals reviewed.   ED Course  Procedures (including critical care time) Labs Review Labs Reviewed  BASIC METABOLIC PANEL - Abnormal; Notable for the following:    Potassium 3.4 (*)    All other components within normal limits  CBC    Imaging Review No results found.   EKG Interpretation   Date/Time:  Sunday June 26 2014 13:04:53 EST Ventricular Rate:  67 PR Interval:  133 QRS Duration: 87 QT Interval:  393 QTC Calculation: 415 R Axis:   72 Text Interpretation:  Sinus rhythm Borderline repolarization abnormality  No significant change since last tracing Confirmed by Winfred Leeds  MD, SAM  [phone]) on 06/26/2014 1:09:20 PM Also confirmed by Zenia Resides  MD, Yeray Tomas  ([id number])  on 06/26/2014 3:09:21 PM      MDM   Final diagnoses:  None    Pt with negative evaluation today, thinks her sx maybe anxiety related, stable for d/c    Leota Jacobsen, MD 06/26/14 1526

## 2016-07-17 ENCOUNTER — Encounter (HOSPITAL_COMMUNITY): Payer: Self-pay | Admitting: *Deleted

## 2016-07-17 ENCOUNTER — Emergency Department (HOSPITAL_COMMUNITY)
Admission: EM | Admit: 2016-07-17 | Discharge: 2016-07-17 | Disposition: A | Discharge status: Home / Self Care | Payer: Medicaid Other | Attending: Emergency Medicine | Admitting: Emergency Medicine

## 2016-07-17 DIAGNOSIS — Z79899 Other long term (current) drug therapy: Secondary | ICD-10-CM | POA: Diagnosis not present

## 2016-07-17 DIAGNOSIS — R109 Unspecified abdominal pain: Secondary | ICD-10-CM

## 2016-07-17 DIAGNOSIS — R300 Dysuria: Secondary | ICD-10-CM | POA: Insufficient documentation

## 2016-07-17 DIAGNOSIS — F17210 Nicotine dependence, cigarettes, uncomplicated: Secondary | ICD-10-CM | POA: Diagnosis not present

## 2016-07-17 DIAGNOSIS — R1030 Lower abdominal pain, unspecified: Secondary | ICD-10-CM | POA: Diagnosis present

## 2016-07-17 LAB — URINALYSIS, ROUTINE W REFLEX MICROSCOPIC
Bacteria, UA: NONE SEEN
Bilirubin Urine: NEGATIVE
Glucose, UA: NEGATIVE mg/dL
Ketones, ur: NEGATIVE mg/dL
Nitrite: NEGATIVE
PROTEIN: NEGATIVE mg/dL
Specific Gravity, Urine: 1.003 — ABNORMAL LOW (ref 1.005–1.030)
pH: 7 (ref 5.0–8.0)

## 2016-07-17 LAB — PREGNANCY, URINE: Preg Test, Ur: NEGATIVE

## 2016-07-17 MED ORDER — NAPROXEN 500 MG PO TABS: 500.0000 mg | ORAL_TABLET | Freq: Two times a day (BID) | ORAL | 0 refills | Status: DC

## 2016-07-17 NOTE — ED Triage Notes (Signed)
Pt c/o L flank pain onset today, pt seen at UC x 3 days ago for bladder pain, states, "they didn't see any bacteria, but they gave me Macrobid" pt c/o nausea, pt denies hematuria, A&O x4

## 2016-07-17 NOTE — ED Provider Notes (Signed)
[place] DEPT Provider Note   CSN: [id number] Arrival date & time: 07/17/16  0905     History   Chief Complaint Chief Complaint  Patient presents with  . Dysuria    HPI Dorothy Schwartz is a 26 y.o. female.  HPI  Patient developed flank pain yesterday evening. It was left-sided and aching in quality.She reports it got a lot worse today. She reports it was radiating to lower abdomen. She states while waiting in the waiting room it got a lot better. She reports shet of burning at the end of the urination. She has been taking Macrobidsince Friday. She was seen at urgent care anddiagnosed with a UTI.She has not had fever or vomiting.No vaginal discharge or bleeding. Patient is monogamous relationship and does not suspect STD. He has had many kidney stones in the past. All have passed spontaneously without need for stents or interventional treatment. Past Medical History:  Diagnosis Date  . Anemia 2011   postop anemia after CS  . Headache(784.0)   . Kidney stones   . Palpitations    per pt due to stress    There are no active problems to display for this patient.   Past Surgical History:  Procedure Laterality Date  . CESAREAN SECTION    . CESAREAN SECTION WITH BILATERAL TUBAL LIGATION Bilateral 08/13/2012   Procedure: CESAREAN SECTION WITH BILATERAL TUBAL LIGATION;  Surgeon: Margarette Asal, MD;  Location: [facility];  Service: Obstetrics;  Laterality: Bilateral;  Repeat edc 08/09/12  . WISDOM TOOTH EXTRACTION      OB History    Gravida Para Term Preterm AB Living   2 2 2     2    SAB TAB Ectopic Multiple Live Births           1       Home Medications    Prior to Admission medications   Medication Sig Start Date End Date Taking? Authorizing Provider  ALPRAZolam Duanne Moron) 1 MG tablet Take 0.5-1 mg by mouth daily as needed for anxiety.     Historical Provider, MD  cyclobenzaprine (FLEXERIL) 10 MG tablet Take 1 tablet (10 mg total) by mouth 2 (two) times daily as needed  for muscle spasms. Patient not taking: Reported on 06/26/2014 11/30/13   West Pugh, NP  metroNIDAZOLE (FLAGYL) 500 MG tablet Take 1 tablet (500 mg total) by mouth 2 (two) times daily. Patient not taking: Reported on 06/26/2014 11/30/13   West Pugh, NP  naproxen (NAPROSYN) 500 MG tablet Take 1 tablet (500 mg total) by mouth 2 (two) times daily. 07/17/16   Charlesetta Shanks, MD    Family History Family History  Problem Relation Age of Onset  . Other Neg Hx     Social History Social History  Substance Use Topics  . Smoking status: Current Every Day Smoker    Packs/day: 1.00    Types: Cigarettes  . Smokeless tobacco: Never Used  . Alcohol use No     Allergies   Morphine and related and Percocet [oxycodone-acetaminophen]   Review of Systems Review of Systems 10 Systems reviewed and are negative for acute change except as noted in the HPI.   Physical Exam Updated Vital Signs BP 172/84 (BP Location: Right Arm)   Pulse (!) 58   Temp 99.5 F (37.5 C) (Oral)   Resp 16   Ht 5\' 3"  (1.6 m)   Wt 167 lb (75.8 kg)   LMP 06/12/2016   SpO2 100%   Breastfeeding? No  BMI 29.58 kg/m   Physical Exam  Constitutional: She is oriented to person, place, and time. She appears well-developed and well-nourished. No distress.  HENT:  Head: Normocephalic and atraumatic.  Eyes: Conjunctivae are normal.  Neck: Neck supple.  Cardiovascular: Normal rate and regular rhythm.   No murmur heard. Pulmonary/Chest: Effort normal and breath sounds normal. No respiratory distress.  Abdominal: Soft. Bowel sounds are normal. She exhibits no distension. There is no tenderness. There is no guarding.  No CVA tenderness to percussion. Mild discomfort to deeper palpation of the muscle bodies of the suprapubic iliac region in the lower back on the left.  Musculoskeletal: Normal range of motion.  Neurological: She is alert and oriented to person, place, and time. She exhibits normal muscle tone.  Coordination normal.  Skin: Skin is warm and dry.  Psychiatric: She has a normal mood and affect.  Nursing note and vitals reviewed.    ED Treatments / Results  Labs (all labs ordered are listed, but only abnormal results are displayed) Labs Reviewed  URINALYSIS, ROUTINE W REFLEX MICROSCOPIC - Abnormal; Notable for the following:       Result Value   Color, Urine STRAW (*)    Specific Gravity, Urine 1.003 (*)    Hgb urine dipstick SMALL (*)    Leukocytes, UA SMALL (*)    Squamous Epithelial / LPF 0-5 (*)    All other components within normal limits  PREGNANCY, URINE    EKG  EKG Interpretation None       Radiology No results found.  Procedures Procedures (including critical care time)  Medications Ordered in ED Medications - No data to display   Initial Impression / Assessment and Plan / ED Course  I have reviewed the triage vital signs and the nursing notes.  Pertinent labs & imaging results that were available during my care of the patient were reviewed by me and considered in my medical decision making (see chart for details).      Final Clinical Impressions(s) / ED Diagnoses   Final diagnoses:  Flank pain  patient is clinically well appearance with normal examination. Urinalysis is negative. I will have her complete Macrobid as prescribed. Patient will take naproxen for lower back discomfort. Suspicions for possible recently passed kidney stone. At this time, no signs of pyelonephritis or obstruction.Return precautions reviewed.  New Prescriptions Discharge Medication List as of 07/17/2016 12:24 PM    START taking these medications   Details  naproxen (NAPROSYN) 500 MG tablet Take 1 tablet (500 mg total) by mouth 2 (two) times daily., Starting Wed 07/17/2016, Print         Charlesetta Shanks, MD 07/17/16 1229

## 2016-09-18 ENCOUNTER — Ambulatory Visit (HOSPITAL_COMMUNITY)
Admission: EM | Admit: 2016-09-18 | Discharge: 2016-09-18 | Disposition: A | Discharge status: Home / Self Care | Payer: Medicaid Other | Attending: Family Medicine | Admitting: Family Medicine

## 2016-09-18 ENCOUNTER — Encounter (HOSPITAL_COMMUNITY): Payer: Self-pay | Admitting: Emergency Medicine

## 2016-09-18 DIAGNOSIS — J020 Streptococcal pharyngitis: Secondary | ICD-10-CM

## 2016-09-18 LAB — POCT RAPID STREP A: STREPTOCOCCUS, GROUP A SCREEN (DIRECT): POSITIVE — AB

## 2016-09-18 MED ORDER — AMOXICILLIN 875 MG PO TABS: 875.0000 mg | ORAL_TABLET | Freq: Two times a day (BID) | ORAL | 0 refills | Status: DC

## 2016-09-18 MED ORDER — ACETAMINOPHEN 325 MG PO TABS
650.0000 mg | ORAL_TABLET | Freq: Once | ORAL | Status: AC
  Administered 2016-09-18: 650 mg via ORAL

## 2016-09-18 MED ORDER — ACETAMINOPHEN 325 MG PO TABS
ORAL_TABLET | ORAL | Status: AC
  Filled 2016-09-18: qty 2

## 2016-09-18 NOTE — ED Provider Notes (Signed)
[place]    CSN: [id number] Arrival date & time: 09/18/16  1420     History   Chief Complaint Chief Complaint  Patient presents with  . Sore Throat  . Generalized Body Aches    HPI Dorothy Schwartz is a 26 y.o. female.   The patient presented to the [facility] with a complaint of a a sore throat, general body aches and a fever of 100.55F at home. Symptoms began yesterday. Patient works at a [employer], [employer]. She has 2 children which have been significantly ill.      Past Medical History:  Diagnosis Date  . Anemia 2011   postop anemia after CS  . Headache(784.0)   . Kidney stones   . Palpitations    per pt due to stress    There are no active problems to display for this patient.   Past Surgical History:  Procedure Laterality Date  . CESAREAN SECTION    . CESAREAN SECTION WITH BILATERAL TUBAL LIGATION Bilateral 08/13/2012   Procedure: CESAREAN SECTION WITH BILATERAL TUBAL LIGATION;  Surgeon: Margarette Asal, MD;  Location: [facility];  Service: Obstetrics;  Laterality: Bilateral;  Repeat edc 08/09/12  . WISDOM TOOTH EXTRACTION      OB History    Gravida Para Term Preterm AB Living   2 2 2     2    SAB TAB Ectopic Multiple Live Births           1       Home Medications    Prior to Admission medications   Medication Sig Start Date End Date Taking? Authorizing Provider  acetaminophen (TYLENOL) 500 MG tablet Take 1,000 mg by mouth once.   Yes Historical Provider, MD  ALPRAZolam Duanne Moron) 1 MG tablet Take 0.5-1 mg by mouth daily as needed for anxiety.    Yes Historical Provider, MD  amoxicillin (AMOXIL) 875 MG tablet Take 1 tablet (875 mg total) by mouth 2 (two) times daily. 09/18/16   Robyn Haber, MD    Family History Family History  Problem Relation Age of Onset  . Other Neg Hx     Social History Social History  Substance Use Topics  . Smoking status: Current Every Day Smoker    Packs/day: 1.00    Types: Cigarettes  . Smokeless tobacco: Never  Used  . Alcohol use No     Allergies   Morphine and related and Percocet [oxycodone-acetaminophen]   Review of Systems Review of Systems  Constitutional: Positive for fatigue and fever.  HENT: Positive for sore throat.   Respiratory: Negative for cough.   Gastrointestinal: Positive for nausea. Negative for vomiting.  Musculoskeletal: Positive for myalgias.  Neurological: Negative.   All other systems reviewed and are negative.    Physical Exam Triage Vital Signs ED Triage Vitals  Enc Vitals Group     BP 09/18/16 1507 (!) 141/80     Pulse Rate 09/18/16 1507 80     Resp 09/18/16 1507 18     Temp 09/18/16 1507 (!) 100.5 F (38.1 C)     Temp Source 09/18/16 1507 Oral     SpO2 09/18/16 1507 100 %     Weight --      Height --      Head Circumference --      Peak Flow --      Pain Score 09/18/16 1503 6     Pain Loc --      Pain Edu? --  Excl. in GC? --    No data found.   Updated Vital Signs BP (!) 141/80 (BP Location: Right Arm)   Pulse 80   Temp (!) 100.5 F (38.1 C) (Oral)   Resp 18   SpO2 100%   Physical Exam  Constitutional: She appears well-developed and well-nourished. No distress.  HENT:  Right Ear: External ear normal.  Left Ear: External ear normal.  Mouth/Throat: Oropharynx is clear and moist.  Eyes: Conjunctivae and EOM are normal. Pupils are equal, round, and reactive to light.  Neck: Normal range of motion. Neck supple.  Cardiovascular: Normal rate.   Pulmonary/Chest: Effort normal.  Musculoskeletal: Normal range of motion.  Neurological: She is alert.  Skin: Skin is warm and dry.  Nursing note and vitals reviewed.    UC Treatments / Results  Labs (all labs ordered are listed, but only abnormal results are displayed) Labs Reviewed  POCT RAPID STREP A - Abnormal; Notable for the following:       Result Value   Streptococcus, Group A Screen (Direct) POSITIVE (*)    All other components within normal limits    EKG  EKG  Interpretation None       Radiology No results found.  Procedures Procedures (including critical care time)  Medications Ordered in UC Medications  acetaminophen (TYLENOL) tablet 650 mg (not administered)     Initial Impression / Assessment and Plan / UC Course  I have reviewed the triage vital signs and the nursing notes.  Pertinent labs & imaging results that were available during my care of the patient were reviewed by me and considered in my medical decision making (see chart for details).     Final Clinical Impressions(s) / UC Diagnoses   Final diagnoses:  Strep pharyngitis    New Prescriptions New Prescriptions   AMOXICILLIN (AMOXIL) 875 MG TABLET    Take 1 tablet (875 mg total) by mouth 2 (two) times daily.     Robyn Haber, MD 09/18/16 1520

## 2016-09-18 NOTE — ED Triage Notes (Signed)
The patient presented to the [facility] with a complaint of a a sore throat, general body aches and a fever of 100.77F at home.

## 2017-02-09 ENCOUNTER — Ambulatory Visit (HOSPITAL_COMMUNITY)
Admission: EM | Admit: 2017-02-09 | Discharge: 2017-02-09 | Disposition: A | Discharge status: Home / Self Care | Payer: Self-pay | Attending: Family Medicine | Admitting: Family Medicine

## 2017-02-09 ENCOUNTER — Encounter (HOSPITAL_COMMUNITY): Payer: Self-pay | Admitting: Family Medicine

## 2017-02-09 DIAGNOSIS — H9202 Otalgia, left ear: Secondary | ICD-10-CM

## 2017-02-09 DIAGNOSIS — H6122 Impacted cerumen, left ear: Secondary | ICD-10-CM

## 2017-02-09 NOTE — Discharge Instructions (Signed)
OK to use Debrox (peroxide) in the ear to loosen up wax. Also recommend using a bulb syringe (for removing boogers from baby's noses) to flush through warm water. Do not use Q-tips as this can impact wax further.

## 2017-02-09 NOTE — ED Triage Notes (Signed)
Pt here for left ear fulness, pain. sts that he got water in her ear a few days ago.

## 2017-02-09 NOTE — ED Provider Notes (Signed)
[place]    CSN: [id number] Arrival date & time: 02/09/17  1518     History   Chief Complaint Chief Complaint  Patient presents with  . Otalgia    HPI Dorothy Schwartz is a 26 y.o. female.   HPI  Pt presents for L sided ear pain that started 3 days ago. Water got in her ear prior to this happening. Her L ear tends to make more wax. She feels that she has a "golf ball" in her ear. No drainage. She was able to get a large chunk of wax out of her ear using a bobby pin. She has also tried peroxide, warm compresses, and using a blow dryer near her ear. Denies fevers, drainage, URI symptoms, injury.  Past Medical History:  Diagnosis Date  . Anemia 2011   postop anemia after CS  . Headache(784.0)   . Kidney stones   . Palpitations    per pt due to stress   Past Surgical History:  Procedure Laterality Date  . CESAREAN SECTION    . CESAREAN SECTION WITH BILATERAL TUBAL LIGATION Bilateral 08/13/2012   Procedure: CESAREAN SECTION WITH BILATERAL TUBAL LIGATION;  Surgeon: Margarette Asal, MD;  Location: [facility];  Service: Obstetrics;  Laterality: Bilateral;  Repeat edc 08/09/12  . WISDOM TOOTH EXTRACTION      OB History    Gravida Para Term Preterm AB Living   2 2 2     2    SAB TAB Ectopic Multiple Live Births           1       Home Medications    Prior to Admission medications   Medication Sig Start Date End Date Taking? Authorizing Provider  acetaminophen (TYLENOL) 500 MG tablet Take 1,000 mg by mouth once.    [provider]  ALPRAZolam Duanne Moron) 1 MG tablet Take 0.5-1 mg by mouth daily as needed for anxiety.     [provider]    Family History Family History  Problem Relation Age of Onset  . Other Neg Hx     Social History Social History  Substance Use Topics  . Smoking status: Current Every Day Smoker    Packs/day: 1.00    Types: Cigarettes  . Smokeless tobacco: Never Used  . Alcohol use No     Allergies   Morphine and  related and Percocet [oxycodone-acetaminophen]   Review of Systems Review of Systems  Constitutional: Negative for fever.  HENT: Positive for ear pain.      Physical Exam Triage Vital Signs ED Triage Vitals [02/09/17 1536]  Enc Vitals Group     BP (!) 168/105     Pulse Rate 70     Resp 16     Temp 98.5 F (36.9 C)     Temp Source Oral     SpO2 98 %   Updated Vital Signs BP (!) 168/105 (BP Location: Left Arm) Comment: reported BP to nurse [name]  Pulse 70   Temp 98.5 F (36.9 C) (Oral)   Resp 16   SpO2 98%   Physical Exam  Constitutional: She is oriented to person, place, and time. She appears well-developed and well-nourished. No distress.  HENT:  Head: Normocephalic.  No TTP to external ear, L canal is 100% occlude, no visible abn to canal though this is limited R canal patent, TM neg  Eyes: EOM are normal.  Pulmonary/Chest: No respiratory distress.  Neurological: She is alert and oriented to person,  place, and time.  Skin: Skin is warm and dry. She is not diaphoretic.  Psychiatric: She has a normal mood and affect. Judgment normal.   UC Treatments / Results  Procedures .Ear Cerumen Removal Date/Time: 02/09/2017 4:05 PM Performed by: Shelda Pal Authorized by: Shelda Pal   Consent:    Consent obtained:  Verbal   Consent given by:  Patient Procedure details:    Location:  L ear   Procedure type: irrigation   Post-procedure details:    Inspection:  Macerated skin and TM intact   Hearing quality:  Improved   Patient tolerance of procedure:  Tolerated well, no immediate complications    Initial Impression / Assessment and Plan / UC Course  I have reviewed the triage vital signs and the nursing notes.  Pertinent labs & imaging results that were available during my care of the patient were reviewed by me and considered in my medical decision making (see chart for details).  Pt presents with ear pain and wax buildup. Irrigation  successful and pt reports resolution of symptoms. She is to use peroxide in future along with home irrigation. These instructions were verbalized and written down for pt. F/u with PCP if symptoms fail to improve. Should f/u anyway for her BP. She voiced understanding and agreement to the plan.   Final Clinical Impressions(s) / UC Diagnoses   Final diagnoses:  Impacted cerumen of left ear    New Prescriptions Current Discharge Medication List       Controlled Substance Prescriptions [place] Controlled Substance Registry consulted? Not Applicable   Shelda Pal, [place] 02/09/17 [phone]

## 2017-03-25 ENCOUNTER — Encounter: Payer: Self-pay | Admitting: Internal Medicine

## 2017-03-25 DIAGNOSIS — I10 Essential (primary) hypertension: Secondary | ICD-10-CM | POA: Insufficient documentation

## 2017-03-25 DIAGNOSIS — N200 Calculus of kidney: Secondary | ICD-10-CM

## 2017-03-25 HISTORY — DX: Calculus of kidney: N20.0

## 2017-03-25 NOTE — Progress Notes (Signed)
   [facility] Phone: [phone]   Date of Visit: 03/26/2017   HPI:  Patient is here to establish care.  She would like to discuss hypertension today  Hypertension: -Per chart review she has had multiple elevated blood pressures recorded -She has never been on a medication for hypertension -He denies any chest pain, headaches, blurred vision -She does not check her blood pressure at home -Denies any family history of early hypertension -Tobacco use: One pack per day for 13 years  PMH:  History of kidney stones Intermittent palpitations Panic Attacks  Social history: -She lives with her 3 sons and pet dogs -Works at [employer] -Does not exercise regularly -No alcohol use -Does smoke 1 pack of cigarettes per day for the past 13 years -No other drug use such as marijuana or cocaine  Surgical history: -C-sections 2011 and 2014 -Tubal ligation in 2014   ROS:  Review of Systems  Constitutional: Negative for chills, fever and weight loss.  Eyes: Negative for blurred vision.  Respiratory: Negative for cough, hemoptysis, sputum production and shortness of breath.   Cardiovascular: Positive for palpitations. Negative for chest pain.  Gastrointestinal: Negative for abdominal pain, constipation, diarrhea, nausea and vomiting.  Genitourinary: Negative for dysuria.  Musculoskeletal: Negative for joint pain and myalgias.  Skin: Negative for rash.       Changing mole   Neurological: Positive for dizziness and headaches. Negative for focal weakness.  Psychiatric/Behavioral: Negative for depression and substance abuse. The patient is nervous/anxious.      PHYSICAL EXAM: BP (!) 150/100   Pulse (!) 54   Temp 98.7 F (37.1 C) (Oral)   Ht 5\' 3"  (1.6 m)   Wt 166 lb (75.3 kg)   LMP 02/24/2017   SpO2 98%   BMI 29.41 kg/m  GEN: NAD HEENT: Atraumatic, normocephalic, neck supple, EOMI, sclera clear  CV: RRR, no murmurs, rubs, or  gallops PULM: CTAB, normal effort ABD: Soft, nontender, nondistended, NABS, no organomegaly SKIN: No rash or cyanosis; warm and well-perfused EXTR: No lower extremity edema or calf tenderness PSYCH: Mood and affect euthymic, normal rate and volume of speech NEURO: Awake, alert, no focal deficits grossly, normal speech  ASSESSMENT/PLAN:  Health maintenance:  -Declined flu vaccine  HTN (hypertension) Start Norvasc 5 mg daily.  Will obtain basic labs including CBC, CMP, TSH for initial evaluation for secondary causes.  Discussed DASH diet and regular physical activity.   FOLLOW UP: Follow up in 2 weeks to discuss panic attacks and changing mole  Smiley Houseman, MD PGY [place]

## 2017-03-26 ENCOUNTER — Encounter: Payer: Self-pay | Admitting: Internal Medicine

## 2017-03-26 ENCOUNTER — Ambulatory Visit (INDEPENDENT_AMBULATORY_CARE_PROVIDER_SITE_OTHER): Payer: Medicaid Other | Admitting: Internal Medicine

## 2017-03-26 ENCOUNTER — Encounter: Payer: Self-pay | Admitting: Licensed Clinical Social Worker

## 2017-03-26 VITALS — BP 150/100 | HR 54 | Temp 98.7°F | Ht 63.0 in | Wt 166.0 lb

## 2017-03-26 DIAGNOSIS — Z7689 Persons encountering health services in other specified circumstances: Secondary | ICD-10-CM

## 2017-03-26 DIAGNOSIS — I10 Essential (primary) hypertension: Secondary | ICD-10-CM | POA: Diagnosis not present

## 2017-03-26 MED ORDER — AMLODIPINE BESYLATE 5 MG PO TABS: 5.0000 mg | ORAL_TABLET | Freq: Every day | ORAL | 0 refills | Status: DC

## 2017-03-26 MED FILL — AMLODIPINE BESYLATE 5 MG TA: 5 | 30 days supply | Qty: 30 | Fill #0

## 2017-03-26 NOTE — Assessment & Plan Note (Addendum)
Start Norvasc 5 mg daily.  Will obtain basic labs including CBC, CMP, TSH for initial evaluation for secondary causes.  Discussed DASH diet and regular physical activity.

## 2017-03-26 NOTE — Progress Notes (Signed)
Type of Service: [place] Introduction for New patients  Dorothy Schwartz is a 26 y.o. female: Patient was accompanied by her mother. LCSW introduced self, provided [facility] handout and explained [place].  Patient appreciative of information, identified concerns related to symptoms of anxiety for the past 5 years to include feeling light headed, racing heart, and fear of many things. Family & Social:patient lives with 3 sons ( 13,7, & 4)  and [employer] / Work /Fun: works PT  Life changes: none reported  Issues discussed: current and previous Radiographer, therapeutic;  Education and demonstration of relaxed breathing  Intervention: Reflective listening,  Psychoeducation ; Proofreader provided patient with handout on relaxed breathing and generalized anxiety.  PLAN: 1. Patient will implement relaxed breathing 2. Will F/U with [facility] as needed 3. Referral: none at this time  Casimer Lanius, LCSW Licensed Clinical Social Worker [facility]   [phone] 10:27 AM

## 2017-03-26 NOTE — Patient Instructions (Signed)
Look into DASH diet Try to exercise regularly.  We started you on Norvasc 5mg  daily We will get labs today Follow up in 2 weeks.

## 2017-03-27 ENCOUNTER — Encounter: Payer: Self-pay | Admitting: Internal Medicine

## 2017-03-27 LAB — CMP14+EGFR
ALK PHOS: 56 IU/L (ref 39–117)
ALT: 12 IU/L (ref 0–32)
AST: 12 IU/L (ref 0–40)
Albumin/Globulin Ratio: 2.1 (ref 1.2–2.2)
Albumin: 4.6 g/dL (ref 3.5–5.5)
BUN/Creatinine Ratio: 12 (ref 9–23)
BUN: 10 mg/dL (ref 6–20)
Bilirubin Total: 0.3 mg/dL (ref 0.0–1.2)
CALCIUM: 9.4 mg/dL (ref 8.7–10.2)
CHLORIDE: 106 mmol/L (ref 96–106)
CO2: 23 mmol/L (ref 20–29)
Creatinine, Ser: 0.85 mg/dL (ref 0.57–1.00)
GFR calc Af Amer: 108 mL/min/{1.73_m2} (ref >59)
GFR, EST NON AFRICAN AMERICAN: 94 mL/min/{1.73_m2} (ref >59)
Globulin, Total: 2.2 g/dL (ref 1.5–4.5)
Glucose: 83 mg/dL (ref 65–99)
Potassium: 4.1 mmol/L (ref 3.5–5.2)
Sodium: 141 mmol/L (ref 134–144)
Total Protein: 6.8 g/dL (ref 6.0–8.5)

## 2017-03-27 LAB — TSH: TSH: 0.808 u[IU]/mL (ref 0.450–4.500)

## 2017-03-27 LAB — CBC
Hematocrit: 40.9 % (ref 34.0–46.6)
Hemoglobin: 14.1 g/dL (ref 11.1–15.9)
MCH: 30.3 pg (ref 26.6–33.0)
MCHC: 34.5 g/dL (ref 31.5–35.7)
MCV: 88 fL (ref 79–97)
PLATELETS: 278 10*3/uL (ref 150–379)
RBC: 4.66 x10E6/uL (ref 3.77–5.28)
RDW: 13.2 % (ref 12.3–15.4)
WBC: 8.4 10*3/uL (ref 3.4–10.8)

## 2017-04-09 NOTE — Progress Notes (Signed)
   [facility] Phone: [phone]   Date of Visit: 04/10/2017   HPI:  HTN:  - established care in 02/2017. Was started on Norvasc 5mg  daily. Was instructed to follow up in 2 weeks. Patient presents today for follow up.  -She reports that her headaches have resolved since starting her medication.  Denies any chest pain, shortness of breath, headaches, blurred vision  Anxiety: GAD7 score 4, not difficult at all PHQ 9 score 1, not difficult at all -She reports that her anxiety still bothers her sometimes.  We discussed about therapy but she is not interested in this.  She asks if there is a medication that she can take as needed for anxiety instead of taking a daily medication.  We discussed that the as needed medication is not a great option for chronic control of her anxiety.  I discussed that Xanax is not a good option especially if she is not considering starting a daily medication for maintenance.  We also discussed Atarax as needed.  She would like more information about this and she will get back to me about this.  Changing mole: -She reports that she has a mole on the lower lid of her left eye since childhood.  But recently has noticed that it is getting larger and darker. -No pain or tenderness or drainage from the area. -No personal history of skin cancer -She does wear sunscreen daily.  ROS: See HPI.  [place]:  PMH: HTN Nephrolithiasis  PHYSICAL EXAM: BP 132/84   Pulse 65   Temp 98.3 F (36.8 C) (Oral)   Wt 160 lb (72.6 kg)   LMP 03/27/2017   SpO2 97%   BMI 28.34 kg/m  GEN: NAD HEENT: Left eye lower eyelid: Well-circumscribed mole with diameter of 0.5-0.6cm.  There is lighter pigmentation on the superior portion of the dark pigmentation on the lower portion of the lobe.  Nontender. CV: RRR, no murmurs, rubs, or gallops PULM: CTAB, normal effort ABD: Soft, nontender, nondistended, NABS, no organomegaly SKIN: No rash or cyanosis; warm and  well-perfused EXTR: No lower extremity edema or calf tenderness PSYCH: Mood and affect euthymic, normal rate and volume of speech NEURO: Awake, alert, no focal deficits grossly, normal speech   ASSESSMENT/PLAN:  Changing Mole:  - refer to dermatology  HTN (hypertension) Blood pressure controlled.  Continue Norvasc 5 mg daily.  Anxiety: patient answers last question as "not difficult at all". She asks if there is a medication that she can take as needed for anxiety instead of taking a daily medication.  We discussed that the as needed medication is not a great option for chronic control of her anxiety.  I discussed that Xanax is not a good option especially if she is not considering starting a daily medication for maintenance.  We also discussed Atarax as needed.  She would like more information about this and she will get back to me about this.  Follow up for pap.  Signed release of information for prior Pap smear results.  Smiley Houseman, MD PGY [place]

## 2017-04-10 ENCOUNTER — Ambulatory Visit: Payer: Medicaid Other | Admitting: Internal Medicine

## 2017-04-10 ENCOUNTER — Encounter: Payer: Self-pay | Admitting: Internal Medicine

## 2017-04-10 ENCOUNTER — Other Ambulatory Visit: Payer: Self-pay

## 2017-04-10 VITALS — BP 132/84 | HR 65 | Temp 98.3°F | Wt 160.0 lb

## 2017-04-10 DIAGNOSIS — D229 Melanocytic nevi, unspecified: Secondary | ICD-10-CM

## 2017-04-10 DIAGNOSIS — F411 Generalized anxiety disorder: Secondary | ICD-10-CM

## 2017-04-10 DIAGNOSIS — I10 Essential (primary) hypertension: Secondary | ICD-10-CM | POA: Diagnosis not present

## 2017-04-10 NOTE — Assessment & Plan Note (Signed)
Blood pressure controlled.  Continue Norvasc 5 mg daily.

## 2017-04-10 NOTE — Patient Instructions (Signed)
Make an appointment for pap smear   Think about if you would like to be on a medication for anxiety   Continue Norvasc (amlodipine) because your blood pressure looks great!  I made a referral to the dermatologist. You will get a call in the next 1 to 1.5 weeks.    Hydroxyzine capsules or tablets What is this medicine? HYDROXYZINE (hye Canaseraga i zeen) is an antihistamine. This medicine is used to treat allergy symptoms. It is also used to treat anxiety and tension. This medicine can be used with other medicines to induce sleep before surgery. This medicine may be used for other purposes; ask your health care provider or pharmacist if you have questions. COMMON BRAND NAME(S): ANX, Atarax, Rezine, Vistaril What should I tell my health care provider before I take this medicine? They need to know if you have any of these conditions: -any chronic illness -difficulty passing urine -glaucoma -heart disease -kidney disease -liver disease -lung disease -an unusual or allergic reaction to hydroxyzine, cetirizine, other medicines, foods, dyes, or preservatives -pregnant or trying to get pregnant -breast-feeding How should I use this medicine? Take this medicine by mouth with a full glass of water. Follow the directions on the prescription label. You may take this medicine with food or on an empty stomach. Take your medicine at regular intervals. Do not take your medicine more often than directed. Talk to your pediatrician regarding the use of this medicine in children. Special care may be needed. While this drug may be prescribed for children as young as 38 years of age for selected conditions, precautions do apply. Patients over 20 years old may have a stronger reaction and need a smaller dose. Overdosage: If you think you have taken too much of this medicine contact a poison control center or emergency room at once. NOTE: This medicine is only for you. Do not share this medicine with others. What  if I miss a dose? If you miss a dose, take it as soon as you can. If it is almost time for your next dose, take only that dose. Do not take double or extra doses. What may interact with this medicine? -alcohol -barbiturate medicines for sleep or seizures -medicines for colds, allergies -medicines for depression, anxiety, or emotional disturbances -medicines for pain -medicines for sleep -muscle relaxants This list may not describe all possible interactions. Give your health care provider a list of all the medicines, herbs, non-prescription drugs, or dietary supplements you use. Also tell them if you smoke, drink alcohol, or use illegal drugs. Some items may interact with your medicine. What should I watch for while using this medicine? Tell your doctor or health care professional if your symptoms do not improve. You may get drowsy or dizzy. Do not drive, use machinery, or do anything that needs mental alertness until you know how this medicine affects you. Do not stand or sit up quickly, especially if you are an older patient. This reduces the risk of dizzy or fainting spells. Alcohol may interfere with the effect of this medicine. Avoid alcoholic drinks. Your mouth may get dry. Chewing sugarless gum or sucking hard candy, and drinking plenty of water may help. Contact your doctor if the problem does not go away or is severe. This medicine may cause dry eyes and blurred vision. If you wear contact lenses you may feel some discomfort. Lubricating drops may help. See your eye doctor if the problem does not go away or is severe. If you are receiving  skin tests for allergies, tell your doctor you are using this medicine. What side effects may I notice from receiving this medicine? Side effects that you should report to your doctor or health care professional as soon as possible: -fast or irregular heartbeat -difficulty passing urine -seizures -slurred speech or confusion -tremor Side effects that  usually do not require medical attention (report to your doctor or health care professional if they continue or are bothersome): -constipation -drowsiness -fatigue -headache -stomach upset This list may not describe all possible side effects. Call your doctor for medical advice about side effects. You may report side effects to FDA at 1-800-FDA-1088. Where should I keep my medicine? Keep out of the reach of children. Store at room temperature between 15 and 30 degrees C (59 and 86 degrees F). Keep container tightly closed. Throw away any unused medicine after the expiration date. NOTE: This sheet is a summary. It may not cover all possible information. If you have questions about this medicine, talk to your doctor, pharmacist, or health care provider.  2018 Elsevier/Gold Standard (2007-09-25 14:50:59)

## 2017-05-02 ENCOUNTER — Other Ambulatory Visit: Payer: Self-pay | Admitting: Internal Medicine

## 2017-05-02 MED FILL — AMLODIPINE BESYLATE 5 MG TA: 5 | 90 days supply | Qty: 90 | Fill #0

## 2017-05-27 NOTE — Progress Notes (Deleted)
   [facility] Phone: [phone]   Date of Visit: 05/28/2017   HPI:  Pap Smear:  - last pap in 2014 with negative cytology (scanned)  ROS: See HPI.  [place]: ***  PHYSICAL EXAM: There were no vitals taken for this visit. Gen: *** HEENT: *** Heart: *** Lungs: *** Neuro: *** Ext: ***  ASSESSMENT/PLAN:  Health maintenance:  -***  No problem-specific Assessment & Plan notes found for this encounter.  FOLLOW UP: Follow up in *** for ***  Smiley Houseman, MD PGY [place]

## 2017-05-28 ENCOUNTER — Ambulatory Visit: Payer: Medicaid Other | Admitting: Internal Medicine

## 2017-05-29 ENCOUNTER — Ambulatory Visit (HOSPITAL_COMMUNITY)
Admission: RE | Admit: 2017-05-29 | Discharge: 2017-05-29 | Disposition: A | Discharge status: Home / Self Care | Payer: Medicaid Other | Source: Ambulatory Visit | Attending: Family Medicine | Admitting: Family Medicine

## 2017-05-29 ENCOUNTER — Ambulatory Visit: Payer: Medicaid Other | Admitting: Family Medicine

## 2017-05-29 ENCOUNTER — Encounter: Payer: Self-pay | Admitting: Family Medicine

## 2017-05-29 VITALS — BP 128/89 | HR 78 | Temp 98.8°F | Ht 62.0 in | Wt 164.0 lb

## 2017-05-29 DIAGNOSIS — R002 Palpitations: Secondary | ICD-10-CM | POA: Diagnosis present

## 2017-05-29 DIAGNOSIS — R9431 Abnormal electrocardiogram [ECG] [EKG]: Secondary | ICD-10-CM | POA: Insufficient documentation

## 2017-05-29 DIAGNOSIS — R51 Headache: Secondary | ICD-10-CM | POA: Diagnosis not present

## 2017-05-29 DIAGNOSIS — R519 Headache, unspecified: Secondary | ICD-10-CM

## 2017-05-29 NOTE — Patient Instructions (Signed)
Palpitations A palpitation is the feeling that your heart:  Has an uneven (irregular) heartbeat.  Is beating faster than normal.  Is fluttering.  Is skipping a beat.  This is usually not a serious problem. In some cases, you may need more medical tests. Follow these instructions at home:  Avoid: ? Caffeine in coffee, tea, soft drinks, diet pills, and energy drinks. ? Chocolate. ? Alcohol.  Do not use any tobacco products. These include cigarettes, chewing tobacco, and e-cigarettes. If you need help quitting, ask your doctor.  Try to reduce your stress. These things may help: ? Yoga. ? Meditation. ? Physical activity. Swimming, jogging, and walking are good choices. ? A method that helps you use your mind to control things in your body, like heartbeats (biofeedback).  Get plenty of rest and sleep.  Take over-the-counter and prescription medicines only as told by your doctor.  Keep all follow-up visits as told by your doctor. This is important. Contact a doctor if:  Your heartbeat is still fast or uneven after 24 hours.  Your palpitations occur more often. Get help right away if:  You have chest pain.  You feel short of breath.  You have a very bad headache.  You feel dizzy.  You pass out (faint). This information is not intended to replace advice given to you by your health care provider. Make sure you discuss any questions you have with your health care provider. Document Released: 02/20/2008 Document Revised: 10/19/2015 Document Reviewed: 01/26/2015 Elsevier Interactive Patient Education  2018 Elsevier Inc.  

## 2017-05-29 NOTE — Progress Notes (Signed)
Subjective:     Patient ID: Dorothy Schwartz, female   DOB: [birth year], 27 y.o.   MRN: [id number]  Palpitations   This is a chronic problem. The current episode started more than 1 year ago (palpitation for many years but worsened in the last few months). Episode frequency: Last episode was this morning. The problem has been gradually worsening. The symptoms are aggravated by stress, caffeine and nicotine (Drinks a bit caffeine, no use of ilicit drug). Associated symptoms include anxiety. Pertinent negatives include no chest pain, dizziness, fever, malaise/fatigue, nausea, near-syncope, shortness of breath or vomiting. She has tried nothing for the symptoms. Risk factors include smoking/tobacco exposure and stress. There is no history of anemia, anxiety or drug use.  Headache: C/O headache almost daily on and off. Hx of migraine. Currently asymptomatic.   Current Outpatient Medications on File Prior to Visit  Medication Sig Dispense Refill  . amLODipine (NORVASC) 5 MG tablet TAKE 1 TABLET BY MOUTH ONCE DAILY 90 tablet 0  . acetaminophen (TYLENOL) 500 MG tablet Take 1,000 mg by mouth once.     No current facility-administered medications on file prior to visit.    Past Medical History:  Diagnosis Date  . Anemia 2011   postop anemia after CS  . Headache(784.0)   . Kidney stones   . Palpitations    per pt due to stress   Vitals:   05/29/17 1522  BP: 128/89  Pulse: 78  Temp: 98.8 F (37.1 C)  TempSrc: Oral  SpO2: 99%  Weight: 164 lb (74.4 kg)  Height: 5\' 2"  (1.575 m)     Review of Systems  Constitutional: Negative for fever and malaise/fatigue.  Respiratory: Negative for shortness of breath.   Cardiovascular: Positive for palpitations. Negative for chest pain and near-syncope.  Gastrointestinal: Negative for nausea and vomiting.  Neurological: Positive for headaches. Negative for dizziness.  Psychiatric/Behavioral: The patient is nervous/anxious.   All other systems reviewed and  are negative.      Objective:   Physical Exam  Constitutional: She is oriented to person, place, and time. She appears well-developed. No distress.  Cardiovascular: Normal rate, regular rhythm and normal heart sounds.  No murmur heard. Pulmonary/Chest: Effort normal and breath sounds normal. No respiratory distress. She has no wheezes.  Abdominal: Soft. Bowel sounds are normal. There is no tenderness.  Musculoskeletal: Normal range of motion. She exhibits no edema.  Neurological: She is alert and oriented to person, place, and time. No cranial nerve deficit. Coordination normal.  Nursing note and vitals reviewed.      Assessment:     Palpitation: ?? Stress related vs anxiety. headache    Plan:     1. EKG done and reviewed by me.     NSR at 72 bpm, non-specific ST and T wave changes similar to the previous EKG done in 2016.    She is currently asymptomatic.    She had a recent TSH, CBC and Bmet that were normal.    Will not repeat lab.    Holter monitoring recommended and was ordered.    She will pick up monitor.  2. Currently without headache.     Neuro exam not detailed since the made this complaint after the visit.     It could be stress related.     Advised to keep headache diary and return soon to see PCP.     In the mean time, take Tylenol as needed for pain.

## 2017-06-18 ENCOUNTER — Ambulatory Visit (INDEPENDENT_AMBULATORY_CARE_PROVIDER_SITE_OTHER): Payer: Medicaid Other

## 2017-06-18 DIAGNOSIS — R002 Palpitations: Secondary | ICD-10-CM | POA: Diagnosis not present

## 2017-06-24 ENCOUNTER — Telehealth: Payer: Self-pay | Admitting: Family Medicine

## 2017-06-24 NOTE — Telephone Encounter (Signed)
HIPPA compliant call back message left.  I will discuss Holter result with her once she calls back or can schedule PCP follow-up to discuss

## 2017-06-25 NOTE — Telephone Encounter (Signed)
I discussed result with patient. Does have rare PVCs. She denies excessive caffeine/coffee intake, does not drink alcohol or use of illicit drugs. She has anxiety. She endorsed persistent palpitation especially when she is anxious or stressed.  As discussed with her, since her symptoms is persistent and worsening, I can refer her to the cardiology now. She stated she will prefer to watch and think about it for now. She is advised to call back soon to f/u with PCP for cards referral.  She then mentioned that while she was on the Holter monitor, she had irregular heart beat on 06/19/17 at about 1 PM, she wanted to know if this was reported. I was only able to see report for 06/18/17. Patient stated she will like to discuss with the cardiologist who read her report. Contact information given. Return precaution, and f/u as needed or sooner with PCP for referral.  She verbalized understanding and agreed with the plan.

## 2017-06-25 NOTE — Telephone Encounter (Signed)
Spoke with patient and she would prefer to discuss results over the phone since she just started a new job and doesn't have extra time to take off.  Will forward to MD to advise. Lorrie Strauch,CMA

## 2017-06-27 ENCOUNTER — Encounter: Payer: Self-pay | Admitting: Internal Medicine

## 2017-08-20 ENCOUNTER — Other Ambulatory Visit: Payer: Self-pay | Admitting: Internal Medicine

## 2017-08-20 MED FILL — AMLODIPINE BESYLATE 5 MG TA: 5 | 90 days supply | Qty: 90 | Fill #0

## 2017-08-26 NOTE — Progress Notes (Signed)
[facility] Phone: [phone]   Date of Visit: 08/27/2017   HPI:  Vaginal odor: -Reports a vaginal odor in the past few weeks -Denies any vaginal discharge or abnormal vaginal bleeding.  Last period was March 15 -Is sexually active -Would like to be screened for STI -Is due for Pap smear today  Hypertension: -Takes Norvasc 5 mg daily.  Overall is compliant with medication.  Misses a few days sometimes. -Before we started her on Norvasc she had headaches 3-4 times a week.  After we restarted on her medication, her symptoms resolved.  In the last week she started having headache but not as frequent or severe.  Her headache is diffuse squeezing nature.  There is associated photophobia and phonophobia but no nausea or vomiting.  She rarely has blurred vision during these headaches.  The headaches last about 30 to 45 minutes until the Tylenol extra strength kicks in. She usually gets these headaches mid day.  She denies any early morning headaches or vomiting. -In October before we started her on antihypertensives we checked CBC, CMP, and TSH which were all normal. -Flu that this morning she had her pharmacist check her blood pressure and it was 160/100.  She was asymptomatic at that time and did not have any headaches.  She came to nurse clinic at our clinic and had her blood pressure rechecked which was around 142/80s.  She denies feeling anxious at that time.   ROS: See HPI.  [place]:  HTN Nephrolithiasis  PHYSICAL EXAM: BP 124/76   Pulse 66   Temp 98.4 F (36.9 C) (Oral)   Wt 164 lb (74.4 kg)   LMP 08/08/2017   SpO2 98%   BMI 30.00 kg/m  GEN: NAD CV: RRR, no murmurs, rubs, or gallops PULM: CTAB, normal effort ABD: Soft, nontender, nondistended, NABS, no organomegaly OZ:HYQMVH genitalia: normal external genitalia, vulva, vagina, cervix, uterus and adnexa.   Thin white discharge noted no cervical motion tenderness.  SKIN: No rash or cyanosis; warm  and well-perfused EXTR: No lower extremity edema or calf tenderness PSYCH: Mood and affect euthymic, normal rate and volume of speech NEURO: Awake, alert, no focal deficits grossly, normal speech   ASSESSMENT/PLAN:  Bacterial vaginosis: Wet prep is consistent with bacterial vaginosis.  Discussed over the phone about this and prescribed metronidazole 500 mg twice daily for 7 days.  Discussed side effects.  Cautioned against drinking alcohol during antibiotic course and a few days after.  We will also screen for STI's: HIV, RPR, gonorrhea and Chlamydia   Cervical cancer screening: Pap completed today  Headaches: Possibly migraine related but she does not have the classic throbbing headache but is more tightness however she has other associated symptoms similar to migraine headache such as photophobia and phonophobia.  The fact that her headaches improved after initiation of amlodipine might suggest a migraine-like etiology versus headache due to hypertension.  Her blood pressure today in clinic at our visit is within normal limits.  I asked her to keep a headache diary to see if there is any pattern to her headaches.  She does not have any red flags that indicate intracranial pathology.   HTN (hypertension) Her blood pressure today at our visit is within normal limits.  I am not quite sure why she had elevated blood pressure at the pharmacy.  It sounds like the pharmacist measure the blood pressure and it was not automatic machine.  At that time she was asymptomatic.  I am hesitant  to increase her blood pressure medication because I do not want her to become hypotensive due to her normal blood pressure now.  I think a secondary cause like pheochromocytoma is still very unlikely.  Will consider ambulatory blood pressure monitoring in the near future.   Smiley Houseman, MD PGY [place]

## 2017-08-27 ENCOUNTER — Other Ambulatory Visit: Payer: Self-pay

## 2017-08-27 ENCOUNTER — Ambulatory Visit (INDEPENDENT_AMBULATORY_CARE_PROVIDER_SITE_OTHER): Payer: Medicaid Other | Admitting: Internal Medicine

## 2017-08-27 ENCOUNTER — Telehealth: Payer: Self-pay | Admitting: *Deleted

## 2017-08-27 ENCOUNTER — Other Ambulatory Visit (HOSPITAL_COMMUNITY)
Admission: RE | Admit: 2017-08-27 | Discharge: 2017-08-27 | Disposition: A | Discharge status: Home / Self Care | Payer: Medicaid Other | Source: Ambulatory Visit | Attending: Family Medicine | Admitting: Family Medicine

## 2017-08-27 ENCOUNTER — Encounter: Payer: Self-pay | Admitting: Internal Medicine

## 2017-08-27 VITALS — BP 124/76 | HR 66 | Temp 98.4°F | Wt 164.0 lb

## 2017-08-27 DIAGNOSIS — R51 Headache: Secondary | ICD-10-CM

## 2017-08-27 DIAGNOSIS — B9689 Other specified bacterial agents as the cause of diseases classified elsewhere: Secondary | ICD-10-CM | POA: Diagnosis not present

## 2017-08-27 DIAGNOSIS — Z114 Encounter for screening for human immunodeficiency virus [HIV]: Secondary | ICD-10-CM | POA: Diagnosis not present

## 2017-08-27 DIAGNOSIS — Z124 Encounter for screening for malignant neoplasm of cervix: Secondary | ICD-10-CM

## 2017-08-27 DIAGNOSIS — N898 Other specified noninflammatory disorders of vagina: Secondary | ICD-10-CM | POA: Insufficient documentation

## 2017-08-27 DIAGNOSIS — I10 Essential (primary) hypertension: Secondary | ICD-10-CM

## 2017-08-27 DIAGNOSIS — N760 Acute vaginitis: Secondary | ICD-10-CM

## 2017-08-27 DIAGNOSIS — Z113 Encounter for screening for infections with a predominantly sexual mode of transmission: Secondary | ICD-10-CM

## 2017-08-27 DIAGNOSIS — R519 Headache, unspecified: Secondary | ICD-10-CM

## 2017-08-27 LAB — POCT WET PREP (WET MOUNT)
Clue Cells Wet Prep Whiff POC: POSITIVE
TRICHOMONAS WET PREP HPF POC: ABSENT

## 2017-08-27 MED ORDER — METRONIDAZOLE 500 MG PO TABS: 500.0000 mg | ORAL_TABLET | Freq: Two times a day (BID) | ORAL | 0 refills | Status: AC

## 2017-08-27 MED FILL — metroNIDAZOLE 500 MG TABS: 500 | 7 days supply | Qty: 14 | Fill #0

## 2017-08-27 NOTE — Patient Instructions (Addendum)
I will call you with the results of the pap and STI screen.   Let me look into what you mentioned about the stress test. I will get back to you in the next day.   Please keep a headache diary. This will be helpful for me.

## 2017-08-27 NOTE — Telephone Encounter (Signed)
noted 

## 2017-08-27 NOTE — Assessment & Plan Note (Signed)
Her blood pressure today at our visit is within normal limits.  I am not quite sure why she had elevated blood pressure at the pharmacy.  It sounds like the pharmacist measure the blood pressure and it was not automatic machine.  At that time she was asymptomatic.  I am hesitant to increase her blood pressure medication because I do not want her to become hypotensive due to her normal blood pressure now.  I think a secondary cause like pheochromocytoma is still very unlikely.  Will consider ambulatory blood pressure monitoring in the near future.

## 2017-08-27 NOTE — Telephone Encounter (Signed)
Pt walks in @ 1:10pm because pharmacy told her to "come to see [place] now" Her BP @ the pharmacy (pt reports it was manual) was 160/100 around 1pm.  Pt is asymptomatic and has been taking her norvasc daily, but is concerned because her diastolic has "been in the 90's for a few weeks now"  Vitals in office @ 1:15pm were BP: 142 / 92 P: 80 SPO2: 99%  Discussed with pt since she is asymptomatic and her BP is lower, there is no need to seek immediate care. Advised with pt that we had no appts sooner than her 3:50pm and that she was more than welcome wait, but she may not be seen until appt anyway.  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  Advised her to give [place] a call immediately if anything changes. She expressed understanding and appreciation.  Will forward to PCP. Clebert Wenger, Salome Spotted, CMA

## 2017-08-28 LAB — RPR: RPR Ser Ql: NONREACTIVE

## 2017-08-28 LAB — HIV ANTIBODY (ROUTINE TESTING W REFLEX): HIV Screen 4th Generation wRfx: NONREACTIVE

## 2017-08-29 LAB — CERVICOVAGINAL ANCILLARY ONLY
Chlamydia: NEGATIVE
Neisseria Gonorrhea: NEGATIVE

## 2017-09-01 ENCOUNTER — Telehealth: Payer: Self-pay

## 2017-09-01 NOTE — Telephone Encounter (Signed)
Called patient and discussed results

## 2017-09-01 NOTE — Telephone Encounter (Signed)
Pt left message on nurse line requesting lab results. Her call back [phone] Wallace Cullens, RN

## 2017-09-02 LAB — CYTOLOGY - PAP: HPV (WINDOPATH): NOT DETECTED

## 2017-09-03 ENCOUNTER — Encounter: Payer: Self-pay | Admitting: Internal Medicine

## 2017-09-03 ENCOUNTER — Telehealth: Payer: Self-pay | Admitting: Internal Medicine

## 2017-09-03 NOTE — Telephone Encounter (Signed)
Patient returned call. Left call back number of [phone]. Danley Danker, RN [facility] RN)

## 2017-09-03 NOTE — Telephone Encounter (Signed)
Attempted to call patient to discuss Pap smear results, but went to voicemail. Left message to call back

## 2017-09-03 NOTE — Telephone Encounter (Signed)
Called patient to discuss pap smear results. Scheduled for colposcopy on 09/11/17 in colpo clinic.

## 2017-09-11 ENCOUNTER — Ambulatory Visit (INDEPENDENT_AMBULATORY_CARE_PROVIDER_SITE_OTHER): Payer: Medicaid Other | Admitting: Family Medicine

## 2017-09-11 ENCOUNTER — Other Ambulatory Visit: Payer: Self-pay

## 2017-09-11 VITALS — BP 138/94 | HR 78 | Temp 98.6°F | Wt 162.0 lb

## 2017-09-11 DIAGNOSIS — Z3202 Encounter for pregnancy test, result negative: Secondary | ICD-10-CM | POA: Diagnosis not present

## 2017-09-11 DIAGNOSIS — R87619 Unspecified abnormal cytological findings in specimens from cervix uteri: Secondary | ICD-10-CM | POA: Diagnosis not present

## 2017-09-11 LAB — POCT URINE PREGNANCY: PREG TEST UR: NEGATIVE

## 2017-09-11 NOTE — Patient Instructions (Signed)
Thank you for coming in today, it was so nice to see you! Today we talked about:    You had a colposcopy today. Your cervix looks healthy and normal. We did not need to check a biopsy today.   Please follow up in 1 year for your next pap smaer. You can schedule this appointment at the front desk before you leave or call the clinic.  If you have any questions or concerns, please do not hesitate to call the office at [phone]. You can also message me directly via MyChart.   Sincerely,  Smitty Cords, MD

## 2017-09-12 ENCOUNTER — Encounter: Payer: Self-pay | Admitting: Family Medicine

## 2017-09-12 NOTE — Progress Notes (Signed)
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 No prior abnormals  Patient given informed consent, signed copy in the chart.  Placed in lithotomy position. Cervix viewed with speculum and colposcope after application of acetic acid.   Colposcopy adequate (entire squamocolumnar junctions seen  in entirety) ?  yes Acetowhite lesions? none Punctation? none Mosaicism?  none Abnormal vasculature?  none Biopsies? no ECC? no Complications? none  COMMENTS:  Patient was given post procedure instructions.   ASC-H on pap with clinically normal colposcopy. Discussed. Repeat pa one year.

## 2017-09-23 ENCOUNTER — Encounter (HOSPITAL_COMMUNITY): Payer: Self-pay | Admitting: Emergency Medicine

## 2017-09-23 DIAGNOSIS — R1013 Epigastric pain: Secondary | ICD-10-CM | POA: Diagnosis present

## 2017-09-23 DIAGNOSIS — I10 Essential (primary) hypertension: Secondary | ICD-10-CM | POA: Insufficient documentation

## 2017-09-23 DIAGNOSIS — F17210 Nicotine dependence, cigarettes, uncomplicated: Secondary | ICD-10-CM | POA: Diagnosis not present

## 2017-09-23 LAB — COMPREHENSIVE METABOLIC PANEL
ALK PHOS: 54 U/L (ref 38–126)
ALT: 20 U/L (ref 14–54)
AST: 18 U/L (ref 15–41)
Albumin: 3.9 g/dL (ref 3.5–5.0)
Anion gap: 10 (ref 5–15)
BUN: 12 mg/dL (ref 6–20)
CALCIUM: 9.8 mg/dL (ref 8.9–10.3)
CO2: 25 mmol/L (ref 22–32)
CREATININE: 0.73 mg/dL (ref 0.44–1.00)
Chloride: 104 mmol/L (ref 101–111)
GFR calc non Af Amer: >60 mL/min (ref >60)
Glucose, Bld: 119 mg/dL — ABNORMAL HIGH (ref 65–99)
Potassium: 3.7 mmol/L (ref 3.5–5.1)
Sodium: 139 mmol/L (ref 135–145)
Total Bilirubin: 0.6 mg/dL (ref 0.3–1.2)
Total Protein: 7.2 g/dL (ref 6.5–8.1)

## 2017-09-23 LAB — CBC
HCT: 41.7 % (ref 36.0–46.0)
Hemoglobin: 14.3 g/dL (ref 12.0–15.0)
MCH: 30.2 pg (ref 26.0–34.0)
MCHC: 34.3 g/dL (ref 30.0–36.0)
MCV: 88 fL (ref 78.0–100.0)
PLATELETS: 250 10*3/uL (ref 150–400)
RBC: 4.74 MIL/uL (ref 3.87–5.11)
RDW: 12.3 % (ref 11.5–15.5)
WBC: 7.2 10*3/uL (ref 4.0–10.5)

## 2017-09-23 LAB — URINALYSIS, ROUTINE W REFLEX MICROSCOPIC
BILIRUBIN URINE: NEGATIVE
GLUCOSE, UA: NEGATIVE mg/dL
Hgb urine dipstick: NEGATIVE
KETONES UR: NEGATIVE mg/dL
Nitrite: NEGATIVE
PH: 6 (ref 5.0–8.0)
Protein, ur: NEGATIVE mg/dL
SPECIFIC GRAVITY, URINE: 1.014 (ref 1.005–1.030)

## 2017-09-23 LAB — I-STAT BETA HCG BLOOD, ED (MC, WL, AP ONLY): I-stat hCG, quantitative: <5 m[IU]/mL (ref <5)

## 2017-09-23 LAB — LIPASE, BLOOD: LIPASE: 27 U/L (ref 11–51)

## 2017-09-23 NOTE — ED Triage Notes (Signed)
Patient c/o sharp upper abdominal pain radiating to back today. Endorses nausea but denies V/D. Ambulatory.

## 2017-09-24 ENCOUNTER — Emergency Department (HOSPITAL_COMMUNITY)
Admission: EM | Admit: 2017-09-24 | Discharge: 2017-09-24 | Disposition: A | Discharge status: Home / Self Care | Payer: Medicaid Other | Attending: Emergency Medicine | Admitting: Emergency Medicine

## 2017-09-24 ENCOUNTER — Emergency Department (HOSPITAL_COMMUNITY): Payer: Medicaid Other

## 2017-09-24 DIAGNOSIS — R1013 Epigastric pain: Secondary | ICD-10-CM

## 2017-09-24 MED ORDER — GI COCKTAIL ~~LOC~~
30.0000 mL | Freq: Once | ORAL | Status: AC
  Administered 2017-09-24: 30 mL via ORAL
  Filled 2017-09-24: qty 30

## 2017-09-24 MED ORDER — SUCRALFATE 1 G PO TABS: 1.0000 g | ORAL_TABLET | Freq: Three times a day (TID) | ORAL | 0 refills | Status: DC

## 2017-09-24 MED ORDER — OMEPRAZOLE 20 MG PO CPDR: 20.0000 mg | DELAYED_RELEASE_CAPSULE | Freq: Every day | ORAL | 0 refills | Status: DC

## 2017-09-24 MED ORDER — HYDROMORPHONE HCL 1 MG/ML IJ SOLN
1.0000 mg | Freq: Once | INTRAMUSCULAR | Status: AC
  Administered 2017-09-24: 1 mg via INTRAVENOUS
  Filled 2017-09-24: qty 1

## 2017-09-24 MED ORDER — ONDANSETRON HCL 4 MG/2ML IJ SOLN
4.0000 mg | Freq: Once | INTRAMUSCULAR | Status: AC
  Administered 2017-09-24: 4 mg via INTRAVENOUS
  Filled 2017-09-24: qty 2

## 2017-09-24 NOTE — ED Provider Notes (Signed)
[facility] DEPT Provider Note   CSN: [id number] Arrival date & time: 09/23/17  2101     History   Chief Complaint Chief Complaint  Patient presents with  . Abdominal Pain    HPI Dorothy Schwartz is a 27 y.o. female.  Patient presents to the emergency department with a chief complaint of upper abdominal pain.  She states that the pain started yesterday.  It has gradually worsened.  She states that it comes and goes.  She reports that it radiates to her back.  She reports associated nausea, but no vomiting.  She reports associated postprandial pain.  She denies any fevers chills.  Denies any dysuria, hematuria, or vaginal discharge.  The history is provided by the patient. No language interpreter was used.    Past Medical History:  Diagnosis Date  . Anemia 2011   postop anemia after CS  . Headache(784.0)   . Kidney stones   . Palpitations    per pt due to stress    Patient Active Problem List   Diagnosis Date Noted  . Nephrolithiasis 03/25/2017  . HTN (hypertension) 03/25/2017    Past Surgical History:  Procedure Laterality Date  . CESAREAN SECTION    . CESAREAN SECTION WITH BILATERAL TUBAL LIGATION Bilateral 08/13/2012   Procedure: CESAREAN SECTION WITH BILATERAL TUBAL LIGATION;  Surgeon: Margarette Asal, MD;  Location: [facility];  Service: Obstetrics;  Laterality: Bilateral;  Repeat edc 08/09/12  . WISDOM TOOTH EXTRACTION       OB History    Gravida  2   Para  2   Term  2   Preterm      AB      Living  2     SAB      TAB      Ectopic      Multiple      Live Births  1            Home Medications    Prior to Admission medications   Medication Sig Start Date End Date Taking? Authorizing Provider  acetaminophen (TYLENOL) 500 MG tablet Take 1,000 mg by mouth once.    [provider]  amLODipine (NORVASC) 5 MG tablet TAKE 1 TABLET BY MOUTH ONCE DAILY 08/20/17   Smiley Houseman, MD    Family  History Family History  Problem Relation Age of Onset  . Hypertension Mother   . Mood Disorder Mother   . Lung cancer Mother        tobacco use  . Colon cancer Mother 58  . Colon cancer Father 70  . Lung cancer Maternal Grandmother   . Other Neg Hx     Social History Social History   Tobacco Use  . Smoking status: Current Every Day Smoker    Packs/day: 1.00    Types: Cigarettes    Start date: 2004  . Smokeless tobacco: Never Used  Substance Use Topics  . Alcohol use: No  . Drug use: No     Allergies   Morphine and related and Percocet [oxycodone-acetaminophen]   Review of Systems Review of Systems  All other systems reviewed and are negative.    Physical Exam Updated Vital Signs BP (!) 143/104 (BP Location: Left Arm) Comment: pt states hx of HTN  Pulse 84   Temp 99.1 F (37.3 C) (Oral)   Resp 15   Ht 5\' 2"  (1.575 m)   Wt 73.9 kg (163 lb)   LMP 09/10/2017  SpO2 98%   BMI 29.81 kg/m   Physical Exam  Constitutional: She is oriented to person, place, and time. She appears well-developed and well-nourished.  HENT:  Head: Normocephalic and atraumatic.  Eyes: Pupils are equal, round, and reactive to light. Conjunctivae and EOM are normal.  Neck: Normal range of motion. Neck supple.  Cardiovascular: Normal rate and regular rhythm. Exam reveals no gallop and no friction rub.  No murmur heard. Pulmonary/Chest: Effort normal and breath sounds normal. No respiratory distress. She has no wheezes. She has no rales. She exhibits no tenderness.  Abdominal: Soft. Bowel sounds are normal. She exhibits no distension and no mass. There is tenderness in the right upper quadrant. There is no rebound and no guarding.  Musculoskeletal: Normal range of motion. She exhibits no edema or tenderness.  Neurological: She is alert and oriented to person, place, and time.  Skin: Skin is warm and dry.  Psychiatric: She has a normal mood and affect. Her behavior is normal. Judgment and  thought content normal.  Nursing note and vitals reviewed.    ED Treatments / Results  Labs (all labs ordered are listed, but only abnormal results are displayed) Labs Reviewed  COMPREHENSIVE METABOLIC PANEL - Abnormal; Notable for the following components:      Result Value   Glucose, Bld 119 (*)    All other components within normal limits  URINALYSIS, ROUTINE W REFLEX MICROSCOPIC - Abnormal; Notable for the following components:   APPearance HAZY (*)    Leukocytes, UA TRACE (*)    Bacteria, UA RARE (*)    All other components within normal limits  LIPASE, BLOOD  CBC  I-STAT BETA HCG BLOOD, ED ([facility], [facility], [facility] ONLY)    EKG None  Radiology US Abdomen Limited  Result Date: 09/24/2017 CLINICAL DATA:  Right upper quadrant pain EXAM: ULTRASOUND ABDOMEN LIMITED RIGHT UPPER QUADRANT COMPARISON:  CT 04/30/2007 FINDINGS: Gallbladder: No gallstones or wall thickening visualized. No sonographic Murphy sign noted by sonographer. Common bile duct: Diameter: 3.7 mm Liver: No focal lesion identified. Within normal limits in parenchymal echogenicity. Portal vein is patent on color Doppler imaging with normal direction of blood flow towards the liver. IMPRESSION: Negative right upper quadrant abdominal ultrasound Electronically Signed   By: Donavan Foil M.D.   On: 09/24/2017 01:38    Procedures Procedures (including critical care time)  Medications Ordered in ED Medications  HYDROmorphone (DILAUDID) injection 1 mg (has no administration in time range)  ondansetron (ZOFRAN) injection 4 mg (has no administration in time range)     Initial Impression / Assessment and Plan / ED Course  I have reviewed the triage vital signs and the nursing notes.  Pertinent labs & imaging results that were available during my care of the patient were reviewed by me and considered in my medical decision making (see chart for details).     Patient with upper abdominal pain x2 days.  She is more tender in her  right upper quadrant.  LFTs are normal, however given significant tenderness will check right upper quadrant ultrasound.  Right upper quadrant ultrasound is reassuring.  We will try covering with Carafate and omeprazole.  Could be gastric ulcer.  Recommend GI follow-up.  Patient understands and agrees to plan.  She is stable ready for discharge.  She feels improved at discharge.  Final Clinical Impressions(s) / ED Diagnoses   Final diagnoses:  Epigastric pain    ED Discharge Orders        Ordered  sucralfate (CARAFATE) 1 g tablet  3 times daily with meals & bedtime     09/24/17 0310    omeprazole (PRILOSEC) 20 MG capsule  Daily     09/24/17 0310       Montine Circle, PA-C 09/24/17 [id number]    [facility] Porter, MD 09/24/17 [phone]

## 2017-12-17 ENCOUNTER — Ambulatory Visit: Payer: Medicaid Other | Admitting: Family Medicine

## 2017-12-17 ENCOUNTER — Other Ambulatory Visit: Payer: Self-pay

## 2017-12-17 ENCOUNTER — Encounter: Payer: Self-pay | Admitting: Family Medicine

## 2017-12-17 VITALS — BP 138/84 | HR 58 | Temp 98.1°F | Wt 168.0 lb

## 2017-12-17 DIAGNOSIS — L259 Unspecified contact dermatitis, unspecified cause: Secondary | ICD-10-CM | POA: Diagnosis not present

## 2017-12-17 MED ORDER — AMLODIPINE BESYLATE 5 MG PO TABS: 5.0000 mg | ORAL_TABLET | Freq: Every day | ORAL | 0 refills | Status: DC

## 2017-12-17 MED ORDER — TRIAMCINOLONE ACETONIDE 0.5 % EX OINT: 1.0000 "application " | TOPICAL_OINTMENT | Freq: Two times a day (BID) | CUTANEOUS | 0 refills | Status: DC

## 2017-12-17 MED FILL — TRIAMCINOLONE 0.5% OINTMENT: 0.5 | 30 days supply | Qty: 60 | Fill #0

## 2017-12-17 MED FILL — AMLODIPINE BESYLATE 5 MG TA: 5 | 90 days supply | Qty: 90 | Fill #0

## 2017-12-17 NOTE — Patient Instructions (Addendum)

## 2017-12-17 NOTE — Progress Notes (Signed)
   Subjective:    Patient ID: Dorothy Schwartz, female    DOB: [birth year], 27 y.o.   MRN: [id number]   CC: Rash   HPI: Patient is a 27 yo female who presents here today complaining of rash for the past 4 days. Patient reports that rash started on her right flank and itchy. [place] and thinks she develop the rash afterwards. It is now starting to spread to her left flank and around her belly button. Patient report she slept in her bathing suit after swimming [place] the lake all day. Patient reports that she has been using a steroid cream (triamcinolone?, starts with a T) for the past 3 days without any significant change in her symptoms. Patient denies any pain, or drainage associated with her rash.  Smoking status reviewed   ROS: all other systems were reviewed and are negative other than in the HPI   Past Medical History:  Diagnosis Date  . Anemia 2011   postop anemia after CS  . Headache(784.0)   . Kidney stones   . Palpitations    per pt due to stress    Past Surgical History:  Procedure Laterality Date  . CESAREAN SECTION    . CESAREAN SECTION WITH BILATERAL TUBAL LIGATION Bilateral 08/13/2012   Procedure: CESAREAN SECTION WITH BILATERAL TUBAL LIGATION;  Surgeon: Margarette Asal, MD;  Location: [facility];  Service: Obstetrics;  Laterality: Bilateral;  Repeat edc 08/09/12  . WISDOM TOOTH EXTRACTION      Past medical history, surgical, family, and social history reviewed and updated in the EMR as appropriate.  Objective:  BP 138/84   Pulse (!) 58   Temp 98.1 F (36.7 C) (Oral)   Wt 168 lb (76.2 kg)   LMP 12/05/2017 (Exact Date)   SpO2 98%   BMI 30.73 kg/m   Vitals and nursing note reviewed    General: NAD, pleasant, able to participate in exam Cardiac: RRR, normal heart sounds, no murmurs. 2+ radial and PT pulses bilaterally Respiratory: CTAB, normal effort, No wheezes, rales or rhonchi Abdomen: soft, nontender, nondistended, no hepatic  or splenomegaly, +BS Extremities: no edema or cyanosis. WWP. Skin: Patient has erythematous patch on her right flank, with multiple other surrounding similar lesions. No scale or drainage. Neuro: alert and oriented x4, no focal deficits Psych: Normal affect and mood   Assessment & Plan:    Contact dermatitis Patient present with rash on her flank for the past 4 days which is now spreading. Patient has tried low potency steroid on the rash for the past 2 days without any change. On skin exam, rash appears to be more consistent with contact dermatitis after patient slept in her bathing suit after swimming [place] the lake. Low likelihood for shingles given age or fungal infection.  --Will prescribe triamcinolone 0.5% ointment to be applied BID inaffected areas for 7-10. --Return if symptoms do not improve or worsen with above regimen.    Marjie Skiff, MD [facility] PGY-3

## 2017-12-17 NOTE — Assessment & Plan Note (Signed)
Patient present with rash on her flank for the past 4 days which is now spreading. Patient has tried low potency steroid on the rash for the past 2 days without any change. On skin exam, rash appears to be more consistent with contact dermatitis after patient slept in her bathing [place]. Low likelihood for shingles given age or fungal infection.  --Will prescribe triamcinolone 0.5% ointment to be applied BID inaffected areas for 7-10. --Return if symptoms do not improve or worsen with above regimen.

## 2018-05-08 ENCOUNTER — Encounter (HOSPITAL_COMMUNITY): Payer: Self-pay | Admitting: Emergency Medicine

## 2018-05-08 ENCOUNTER — Emergency Department (HOSPITAL_COMMUNITY)
Admission: EM | Admit: 2018-05-08 | Discharge: 2018-05-08 | Disposition: A | Discharge status: Home / Self Care | Payer: Medicaid Other | Attending: Emergency Medicine | Admitting: Emergency Medicine

## 2018-05-08 DIAGNOSIS — Z79899 Other long term (current) drug therapy: Secondary | ICD-10-CM | POA: Insufficient documentation

## 2018-05-08 DIAGNOSIS — I10 Essential (primary) hypertension: Secondary | ICD-10-CM

## 2018-05-08 DIAGNOSIS — R51 Headache: Secondary | ICD-10-CM | POA: Diagnosis present

## 2018-05-08 DIAGNOSIS — F17210 Nicotine dependence, cigarettes, uncomplicated: Secondary | ICD-10-CM | POA: Insufficient documentation

## 2018-05-08 HISTORY — DX: Essential (primary) hypertension: I10

## 2018-05-08 LAB — I-STAT CHEM 8, ED
BUN: 9 mg/dL (ref 6–20)
CALCIUM ION: 1.12 mmol/L — AB (ref 1.15–1.40)
Chloride: 106 mmol/L (ref 98–111)
Creatinine, Ser: 0.6 mg/dL (ref 0.44–1.00)
Glucose, Bld: 113 mg/dL — ABNORMAL HIGH (ref 70–99)
HCT: 39 % (ref 36.0–46.0)
Hemoglobin: 13.3 g/dL (ref 12.0–15.0)
Potassium: 3.3 mmol/L — ABNORMAL LOW (ref 3.5–5.1)
SODIUM: 140 mmol/L (ref 135–145)
TCO2: 23 mmol/L (ref 22–32)

## 2018-05-08 MED ORDER — CLONIDINE HCL 0.1 MG PO TABS
0.1000 mg | ORAL_TABLET | Freq: Once | ORAL | Status: AC
  Administered 2018-05-08: 0.1 mg via ORAL
  Filled 2018-05-08: qty 1

## 2018-05-08 MED ORDER — AMLODIPINE BESYLATE 5 MG PO TABS: 5.0000 mg | ORAL_TABLET | Freq: Every day | ORAL | 0 refills | Status: DC

## 2018-05-08 NOTE — ED Provider Notes (Signed)
[facility] DEPT Provider Note   CSN: [id number] Arrival date & time: 05/08/18  1810     History   Chief Complaint Chief Complaint  Patient presents with  . Hypertension    HPI Dorothy Schwartz is a 27 y.o. female.  27 year old female presents with mild headache which she feels is from her untreated hypertension.  Patient notes some increased anxiety and take some Xanax which did help.  Patient was on Norvasc 5 mg a day but has not taken this for several months.  Denies any anginal type symptoms.  No severe headaches.  No focal neurological deficits.  No other treatments used prior to arrival.     Past Medical History:  Diagnosis Date  . Anemia 2011   postop anemia after CS  . Headache(784.0)   . Hypertension   . Kidney stones   . Palpitations    per pt due to stress    Patient Active Problem List   Diagnosis Date Noted  . Contact dermatitis 12/17/2017  . Nephrolithiasis 03/25/2017  . HTN (hypertension) 03/25/2017    Past Surgical History:  Procedure Laterality Date  . CESAREAN SECTION    . CESAREAN SECTION WITH BILATERAL TUBAL LIGATION Bilateral 08/13/2012   Procedure: CESAREAN SECTION WITH BILATERAL TUBAL LIGATION;  Surgeon: Margarette Asal, MD;  Location: [facility];  Service: Obstetrics;  Laterality: Bilateral;  Repeat edc 08/09/12  . WISDOM TOOTH EXTRACTION       OB History    Gravida  2   Para  2   Term  2   Preterm      AB      Living  2     SAB      TAB      Ectopic      Multiple      Live Births  1            Home Medications    Prior to Admission medications   Medication Sig Start Date End Date Taking? Authorizing Provider  ALPRAZolam Duanne Moron) 1 MG tablet Take 1 mg by mouth daily.   Yes [provider]  amLODipine (NORVASC) 5 MG tablet Take 1 tablet (5 mg total) by mouth daily. Patient not taking: Reported on 05/08/2018 12/17/17   Marjie Skiff, MD  triamcinolone ointment (KENALOG) 0.5  % Apply 1 application topically 2 (two) times daily. For moderate to severe eczema.  Do not use for more than 1 week at a time. Patient not taking: Reported on 05/08/2018 12/17/17   Marjie Skiff, MD    Family History Family History  Problem Relation Age of Onset  . Hypertension Mother   . Mood Disorder Mother   . Lung cancer Mother        tobacco use  . Colon cancer Mother 63  . Colon cancer Father 28  . Lung cancer Maternal Grandmother   . Other Neg Hx     Social History Social History   Tobacco Use  . Smoking status: Current Every Day Smoker    Packs/day: 1.00    Types: Cigarettes    Start date: 2004  . Smokeless tobacco: Never Used  Substance Use Topics  . Alcohol use: No  . Drug use: No     Allergies   Morphine and related and Percocet [oxycodone-acetaminophen]   Review of Systems Review of Systems  All other systems reviewed and are negative.    Physical Exam Updated Vital Signs BP (!) 163/133   Pulse  63   Temp 98.4 F (36.9 C) (Oral)   Resp 15   LMP 05/01/2018   SpO2 100%   Physical Exam Vitals signs and nursing note reviewed.  Constitutional:      General: She is not in acute distress.    Appearance: Normal appearance. She is well-developed. She is not toxic-appearing.  HENT:     Head: Normocephalic and atraumatic.  Eyes:     General: Lids are normal.     Conjunctiva/sclera: Conjunctivae normal.     Pupils: Pupils are equal, round, and reactive to light.  Neck:     Musculoskeletal: Normal range of motion and neck supple.     Thyroid: No thyroid mass.     Trachea: No tracheal deviation.  Cardiovascular:     Rate and Rhythm: Normal rate and regular rhythm.     Heart sounds: Normal heart sounds. No murmur. No gallop.   Pulmonary:     Effort: Pulmonary effort is normal. No respiratory distress.     Breath sounds: Normal breath sounds. No stridor. No decreased breath sounds, wheezing, rhonchi or rales.  Abdominal:     General: Bowel  sounds are normal. There is no distension.     Palpations: Abdomen is soft.     Tenderness: There is no abdominal tenderness. There is no rebound.  Musculoskeletal: Normal range of motion.        General: No tenderness.  Skin:    General: Skin is warm and dry.     Findings: No abrasion or rash.  Neurological:     Mental Status: She is alert and oriented to person, place, and time.     GCS: GCS eye subscore is 4. GCS verbal subscore is 5. GCS motor subscore is 6.     Cranial Nerves: No cranial nerve deficit.     Sensory: No sensory deficit.  Psychiatric:        Speech: Speech normal.        Behavior: Behavior normal.      ED Treatments / Results  Labs (all labs ordered are listed, but only abnormal results are displayed) Labs Reviewed  I-STAT CHEM 8, ED    EKG None  Radiology No results found.  Procedures Procedures (including critical care time)  Medications Ordered in ED Medications  cloNIDine (CATAPRES) tablet 0.1 mg (has no administration in time range)     Initial Impression / Assessment and Plan / ED Course  I have reviewed the triage vital signs and the nursing notes.  Pertinent labs & imaging results that were available during my care of the patient were reviewed by me and considered in my medical decision making (see chart for details).     Patient blood pressure treated with clonidine and good response to that.  Renal function normal.  Will prescribe patient amlodipine and she will follow with her doctor.  Final Clinical Impressions(s) / ED Diagnoses   Final diagnoses:  None    ED Discharge Orders    None       Lacretia Leigh, MD 05/08/18 1947

## 2018-05-08 NOTE — ED Triage Notes (Signed)
per pt, states she has not been on BP meds for awhile-states she knows her BP has been running high-states she took 1 mg of Xanax prior to coming to ED-complaining of slight headache

## 2018-05-11 MED FILL — AMLODIPINE BESYLATE 5 MG TA: 5 | 30 days supply | Qty: 30 | Fill #0

## 2018-05-25 MED FILL — BENZONATATE 200 MG CAP: 200 | 30 days supply | Qty: 90 | Fill #0

## 2018-05-25 MED FILL — PROAIR HFA 90 MCG INHALER: 108 (90 BAS | 30 days supply | Qty: 9 | Fill #0

## 2018-05-25 MED FILL — TAMIFLU 75 MG GELCAP: 75 | 5 days supply | Qty: 10 | Fill #0

## 2018-07-18 ENCOUNTER — Observation Stay
Admission: EM | Admit: 2018-07-18 | Discharge: 2018-07-20 | Disposition: A | Discharge status: Home / Self Care | Payer: Medicaid Other | Attending: Surgery | Admitting: Surgery

## 2018-07-18 ENCOUNTER — Other Ambulatory Visit: Payer: Self-pay

## 2018-07-18 ENCOUNTER — Emergency Department: Payer: Medicaid Other

## 2018-07-18 DIAGNOSIS — Z885 Allergy status to narcotic agent status: Secondary | ICD-10-CM | POA: Insufficient documentation

## 2018-07-18 DIAGNOSIS — I10 Essential (primary) hypertension: Secondary | ICD-10-CM | POA: Diagnosis not present

## 2018-07-18 DIAGNOSIS — W010XXA Fall on same level from slipping, tripping and stumbling without subsequent striking against object, initial encounter: Secondary | ICD-10-CM | POA: Diagnosis not present

## 2018-07-18 DIAGNOSIS — E876 Hypokalemia: Secondary | ICD-10-CM

## 2018-07-18 DIAGNOSIS — S82862A Displaced Maisonneuve's fracture of left leg, initial encounter for closed fracture: Secondary | ICD-10-CM | POA: Insufficient documentation

## 2018-07-18 DIAGNOSIS — S93432A Sprain of tibiofibular ligament of left ankle, initial encounter: Secondary | ICD-10-CM

## 2018-07-18 DIAGNOSIS — Z79899 Other long term (current) drug therapy: Secondary | ICD-10-CM | POA: Diagnosis not present

## 2018-07-18 DIAGNOSIS — F419 Anxiety disorder, unspecified: Secondary | ICD-10-CM | POA: Diagnosis not present

## 2018-07-18 DIAGNOSIS — F17210 Nicotine dependence, cigarettes, uncomplicated: Secondary | ICD-10-CM | POA: Insufficient documentation

## 2018-07-18 DIAGNOSIS — S82892A Other fracture of left lower leg, initial encounter for closed fracture: Secondary | ICD-10-CM | POA: Diagnosis present

## 2018-07-18 DIAGNOSIS — S99912A Unspecified injury of left ankle, initial encounter: Secondary | ICD-10-CM | POA: Diagnosis present

## 2018-07-18 DIAGNOSIS — S82402A Unspecified fracture of shaft of left fibula, initial encounter for closed fracture: Secondary | ICD-10-CM

## 2018-07-18 NOTE — ED Triage Notes (Signed)
Pt states she slipped in mud approx one hour pta. Pt states her left foot appeared "off to the left and I popped it back in place". Pt with swelling noted. Cms intact to toes, ice applied in triage. Pt complains of left ankle pain.

## 2018-07-19 ENCOUNTER — Observation Stay: Payer: Medicaid Other | Admitting: Anesthesiology

## 2018-07-19 ENCOUNTER — Encounter
Admission: EM | Disposition: A | Discharge status: Home / Self Care | Payer: Self-pay | Source: Home / Self Care | Attending: Surgery

## 2018-07-19 ENCOUNTER — Observation Stay: Payer: Medicaid Other

## 2018-07-19 DIAGNOSIS — S82892A Other fracture of left lower leg, initial encounter for closed fracture: Secondary | ICD-10-CM | POA: Diagnosis present

## 2018-07-19 HISTORY — PX: ORIF ANKLE FRACTURE: SHX5408

## 2018-07-19 LAB — HCG, QUANTITATIVE, PREGNANCY: hCG, Beta Chain, Quant, S: <1 m[IU]/mL (ref <5)

## 2018-07-19 LAB — CBC WITH DIFFERENTIAL/PLATELET
Abs Immature Granulocytes: 0.05 10*3/uL (ref 0.00–0.07)
BASOS ABS: 0.1 10*3/uL (ref 0.0–0.1)
Basophils Relative: 0 %
Eosinophils Absolute: 0 10*3/uL (ref 0.0–0.5)
Eosinophils Relative: 0 %
HCT: 42.8 % (ref 36.0–46.0)
Hemoglobin: 15.1 g/dL — ABNORMAL HIGH (ref 12.0–15.0)
Immature Granulocytes: 0 %
LYMPHS PCT: 11 %
Lymphs Abs: 1.4 10*3/uL (ref 0.7–4.0)
MCH: 30.3 pg (ref 26.0–34.0)
MCHC: 35.3 g/dL (ref 30.0–36.0)
MCV: 85.8 fL (ref 80.0–100.0)
Monocytes Absolute: 0.4 10*3/uL (ref 0.1–1.0)
Monocytes Relative: 3 %
NRBC: 0 % (ref 0.0–0.2)
Neutro Abs: 10.4 10*3/uL — ABNORMAL HIGH (ref 1.7–7.7)
Neutrophils Relative %: 86 %
Platelets: 342 10*3/uL (ref 150–400)
RBC: 4.99 MIL/uL (ref 3.87–5.11)
RDW: 12.2 % (ref 11.5–15.5)
WBC: 12.3 10*3/uL — ABNORMAL HIGH (ref 4.0–10.5)

## 2018-07-19 LAB — SURGICAL PCR SCREEN
MRSA, PCR: NEGATIVE
Staphylococcus aureus: POSITIVE — AB

## 2018-07-19 LAB — BASIC METABOLIC PANEL
Anion gap: 11 (ref 5–15)
BUN: 7 mg/dL (ref 6–20)
CO2: 24 mmol/L (ref 22–32)
Calcium: 9.1 mg/dL (ref 8.9–10.3)
Chloride: 105 mmol/L (ref 98–111)
Creatinine, Ser: 0.67 mg/dL (ref 0.44–1.00)
GFR calc non Af Amer: >60 mL/min (ref >60)
Glucose, Bld: 108 mg/dL — ABNORMAL HIGH (ref 70–99)
Potassium: 3.2 mmol/L — ABNORMAL LOW (ref 3.5–5.1)
Sodium: 140 mmol/L (ref 135–145)

## 2018-07-19 LAB — PROTIME-INR
INR: 1.03
PROTHROMBIN TIME: 13.4 s (ref 11.4–15.2)

## 2018-07-19 LAB — TYPE AND SCREEN
ABO/RH(D): A NEG
Antibody Screen: NEGATIVE

## 2018-07-19 SURGERY — OPEN REDUCTION INTERNAL FIXATION (ORIF) ANKLE FRACTURE
Anesthesia: Spinal | Laterality: Left

## 2018-07-19 MED ORDER — BUPIVACAINE HCL 0.5 % IJ SOLN
INTRAMUSCULAR | Status: DC | PRN
  Administered 2018-07-19: 10 mL

## 2018-07-19 MED ORDER — FENTANYL CITRATE (PF) 100 MCG/2ML IJ SOLN
50.0000 ug | Freq: Once | INTRAMUSCULAR | Status: AC | PRN
  Administered 2018-07-19: 50 ug via INTRAVENOUS
  Filled 2018-07-19: qty 2

## 2018-07-19 MED ORDER — PROPOFOL 500 MG/50ML IV EMUL
INTRAVENOUS | Status: DC | PRN
  Administered 2018-07-19: 10:00:00 via INTRAVENOUS
  Administered 2018-07-19: 75 ug/kg/min via INTRAVENOUS

## 2018-07-19 MED ORDER — ONDANSETRON HCL 4 MG/2ML IJ SOLN
INTRAMUSCULAR | Status: DC | PRN
  Administered 2018-07-19: 4 mg via INTRAVENOUS

## 2018-07-19 MED ORDER — CEFAZOLIN SODIUM-DEXTROSE 2-4 GM/100ML-% IV SOLN
2.0000 g | Freq: Once | INTRAVENOUS | Status: DC
  Filled 2018-07-19 (×2): qty 100

## 2018-07-19 MED ORDER — SODIUM CHLORIDE 0.9 % IV SOLN
INTRAVENOUS | Status: DC
  Administered 2018-07-19 (×2): via INTRAVENOUS

## 2018-07-19 MED ORDER — ONDANSETRON HCL 4 MG PO TABS: 4.0000 mg | ORAL_TABLET | Freq: Four times a day (QID) | ORAL | Status: DC | PRN

## 2018-07-19 MED ORDER — FLEET ENEMA 7-19 GM/118ML RE ENEM: 1.0000 | ENEMA | Freq: Once | RECTAL | Status: DC | PRN

## 2018-07-19 MED ORDER — DIPHENHYDRAMINE HCL 12.5 MG/5ML PO ELIX: 12.5000 mg | ORAL_SOLUTION | ORAL | Status: DC | PRN

## 2018-07-19 MED ORDER — HYDROMORPHONE HCL 1 MG/ML IJ SOLN
0.2500 mg | INTRAMUSCULAR | Status: DC | PRN
  Administered 2018-07-19 (×2): 0.5 mg via INTRAVENOUS
  Filled 2018-07-19 (×2): qty 1

## 2018-07-19 MED ORDER — MIDAZOLAM HCL 2 MG/2ML IJ SOLN
INTRAMUSCULAR | Status: AC
  Filled 2018-07-19: qty 2

## 2018-07-19 MED ORDER — PROPOFOL 500 MG/50ML IV EMUL
INTRAVENOUS | Status: AC
  Filled 2018-07-19: qty 50

## 2018-07-19 MED ORDER — GENTAMICIN SULFATE 40 MG/ML IJ SOLN
INTRAMUSCULAR | Status: AC
  Filled 2018-07-19: qty 2

## 2018-07-19 MED ORDER — DEXTROSE 5 % IV SOLN
2.0000 g | Freq: Four times a day (QID) | INTRAVENOUS | Status: AC
  Administered 2018-07-19 – 2018-07-20 (×3): 2 g via INTRAVENOUS
  Filled 2018-07-19: qty 20
  Filled 2018-07-19 (×2): qty 100

## 2018-07-19 MED ORDER — KETOROLAC TROMETHAMINE 15 MG/ML IJ SOLN: 15.0000 mg | Freq: Four times a day (QID) | INTRAMUSCULAR | Status: DC | PRN

## 2018-07-19 MED ORDER — BUPIVACAINE HCL (PF) 0.5 % IJ SOLN
INTRAMUSCULAR | Status: DC | PRN
  Administered 2018-07-19: 3 mL

## 2018-07-19 MED ORDER — LORAZEPAM 2 MG/ML IJ SOLN: 1.0000 mg | Freq: Four times a day (QID) | INTRAMUSCULAR | Status: DC | PRN

## 2018-07-19 MED ORDER — ONDANSETRON HCL 4 MG/2ML IJ SOLN
INTRAMUSCULAR | Status: AC
  Filled 2018-07-19: qty 2

## 2018-07-19 MED ORDER — MAGNESIUM HYDROXIDE 400 MG/5ML PO SUSP: 30.0000 mL | Freq: Every day | ORAL | Status: DC | PRN

## 2018-07-19 MED ORDER — KETOROLAC TROMETHAMINE 15 MG/ML IJ SOLN
15.0000 mg | Freq: Four times a day (QID) | INTRAMUSCULAR | Status: AC
  Administered 2018-07-19 – 2018-07-20 (×4): 15 mg via INTRAVENOUS
  Filled 2018-07-19 (×4): qty 1

## 2018-07-19 MED ORDER — CEFAZOLIN SODIUM-DEXTROSE 1-4 GM/50ML-% IV SOLN
INTRAVENOUS | Status: DC | PRN
  Administered 2018-07-19: 2 g via INTRAVENOUS

## 2018-07-19 MED ORDER — POTASSIUM CHLORIDE IN NACL 20-0.9 MEQ/L-% IV SOLN
INTRAVENOUS | Status: DC
  Administered 2018-07-19 (×2): via INTRAVENOUS
  Filled 2018-07-19 (×3): qty 1000

## 2018-07-19 MED ORDER — MIDAZOLAM HCL 5 MG/5ML IJ SOLN
INTRAMUSCULAR | Status: DC | PRN
  Administered 2018-07-19 (×2): 2 mg via INTRAVENOUS

## 2018-07-19 MED ORDER — MUPIROCIN 2 % EX OINT
1.0000 "application " | TOPICAL_OINTMENT | Freq: Two times a day (BID) | CUTANEOUS | Status: DC
  Administered 2018-07-19 – 2018-07-20 (×3): 1 via NASAL
  Filled 2018-07-19: qty 22

## 2018-07-19 MED ORDER — ACETAMINOPHEN 500 MG PO TABS
1000.0000 mg | ORAL_TABLET | Freq: Four times a day (QID) | ORAL | Status: AC
  Administered 2018-07-19 (×2): 1000 mg via ORAL
  Filled 2018-07-19 (×2): qty 2

## 2018-07-19 MED ORDER — ALPRAZOLAM 0.5 MG PO TABS
1.0000 mg | ORAL_TABLET | Freq: Every day | ORAL | Status: DC
  Administered 2018-07-19 – 2018-07-20 (×2): 1 mg via ORAL
  Filled 2018-07-19 (×2): qty 2

## 2018-07-19 MED ORDER — KETOROLAC TROMETHAMINE 30 MG/ML IJ SOLN
30.0000 mg | Freq: Once | INTRAMUSCULAR | Status: AC
  Administered 2018-07-19: 30 mg via INTRAVENOUS
  Filled 2018-07-19: qty 1

## 2018-07-19 MED ORDER — LORAZEPAM 2 MG/ML IJ SOLN: 1.0000 mg | Freq: Once | INTRAMUSCULAR | Status: DC | PRN

## 2018-07-19 MED ORDER — ENOXAPARIN SODIUM 40 MG/0.4ML ~~LOC~~ SOLN
40.0000 mg | SUBCUTANEOUS | Status: DC
  Administered 2018-07-20: 40 mg via SUBCUTANEOUS
  Filled 2018-07-19: qty 0.4

## 2018-07-19 MED ORDER — DOCUSATE SODIUM 100 MG PO CAPS
100.0000 mg | ORAL_CAPSULE | Freq: Two times a day (BID) | ORAL | Status: DC
  Filled 2018-07-19 (×2): qty 1

## 2018-07-19 MED ORDER — HYDROCODONE-ACETAMINOPHEN 5-325 MG PO TABS: 1.0000 | ORAL_TABLET | ORAL | Status: DC | PRN

## 2018-07-19 MED ORDER — OXYCODONE HCL 5 MG PO TABS
5.0000 mg | ORAL_TABLET | ORAL | Status: DC | PRN
  Filled 2018-07-19: qty 1

## 2018-07-19 MED ORDER — ACETAMINOPHEN 650 MG RE SUPP: 650.0000 mg | Freq: Four times a day (QID) | RECTAL | Status: DC | PRN

## 2018-07-19 MED ORDER — AMLODIPINE BESYLATE 5 MG PO TABS
5.0000 mg | ORAL_TABLET | Freq: Every day | ORAL | Status: DC
  Administered 2018-07-19 – 2018-07-20 (×2): 5 mg via ORAL
  Filled 2018-07-19 (×2): qty 1

## 2018-07-19 MED ORDER — BISACODYL 10 MG RE SUPP: 10.0000 mg | Freq: Every day | RECTAL | Status: DC | PRN

## 2018-07-19 MED ORDER — PROPOFOL 10 MG/ML IV BOLUS
INTRAVENOUS | Status: AC
  Filled 2018-07-19: qty 20

## 2018-07-19 MED ORDER — METOCLOPRAMIDE HCL 5 MG/ML IJ SOLN: 5.0000 mg | Freq: Three times a day (TID) | INTRAMUSCULAR | Status: DC | PRN

## 2018-07-19 MED ORDER — FENTANYL CITRATE (PF) 250 MCG/5ML IJ SOLN
INTRAMUSCULAR | Status: AC
  Filled 2018-07-19: qty 5

## 2018-07-19 MED ORDER — BUPIVACAINE HCL (PF) 0.5 % IJ SOLN
INTRAMUSCULAR | Status: AC
  Filled 2018-07-19: qty 10

## 2018-07-19 MED ORDER — LACTATED RINGERS IV SOLN
INTRAVENOUS | Status: DC | PRN
  Administered 2018-07-19 (×2): via INTRAVENOUS

## 2018-07-19 MED ORDER — TRAMADOL HCL 50 MG PO TABS: 50.0000 mg | ORAL_TABLET | Freq: Four times a day (QID) | ORAL | Status: DC | PRN

## 2018-07-19 MED ORDER — POTASSIUM CHLORIDE 10 MEQ/100ML IV SOLN
10.0000 meq | INTRAVENOUS | Status: DC
  Administered 2018-07-19 (×2): 10 meq via INTRAVENOUS
  Filled 2018-07-19 (×4): qty 100

## 2018-07-19 MED ORDER — PANTOPRAZOLE SODIUM 40 MG IV SOLR: 40.0000 mg | Freq: Every day | INTRAVENOUS | Status: DC

## 2018-07-19 MED ORDER — PROPOFOL 10 MG/ML IV BOLUS
INTRAVENOUS | Status: DC | PRN
  Administered 2018-07-19: 60 mg via INTRAVENOUS

## 2018-07-19 MED ORDER — METOCLOPRAMIDE HCL 10 MG PO TABS: 5.0000 mg | ORAL_TABLET | Freq: Three times a day (TID) | ORAL | Status: DC | PRN

## 2018-07-19 MED ORDER — ONDANSETRON HCL 4 MG/2ML IJ SOLN: 4.0000 mg | Freq: Four times a day (QID) | INTRAMUSCULAR | Status: DC | PRN

## 2018-07-19 MED ORDER — CEFAZOLIN SODIUM-DEXTROSE 2-4 GM/100ML-% IV SOLN
2.0000 g | Freq: Four times a day (QID) | INTRAVENOUS | Status: DC
  Filled 2018-07-19 (×3): qty 100

## 2018-07-19 MED ORDER — BUPIVACAINE HCL (PF) 0.5 % IJ SOLN
INTRAMUSCULAR | Status: AC
  Filled 2018-07-19: qty 30

## 2018-07-19 MED ORDER — HYDROCODONE-ACETAMINOPHEN 5-325 MG PO TABS
1.0000 | ORAL_TABLET | ORAL | Status: DC | PRN
  Administered 2018-07-19 – 2018-07-20 (×3): 2 via ORAL
  Filled 2018-07-19: qty 2
  Filled 2018-07-19 (×2): qty 1
  Filled 2018-07-19: qty 2

## 2018-07-19 MED ORDER — ONDANSETRON HCL 4 MG/2ML IJ SOLN
4.0000 mg | Freq: Once | INTRAMUSCULAR | Status: AC | PRN
  Administered 2018-07-19: 4 mg via INTRAVENOUS
  Filled 2018-07-19: qty 2

## 2018-07-19 MED ORDER — ACETAMINOPHEN 325 MG PO TABS: 650.0000 mg | ORAL_TABLET | Freq: Four times a day (QID) | ORAL | Status: DC | PRN

## 2018-07-19 MED ORDER — DOCUSATE SODIUM 100 MG PO CAPS: 100.0000 mg | ORAL_CAPSULE | Freq: Two times a day (BID) | ORAL | Status: DC

## 2018-07-19 MED ORDER — ACETAMINOPHEN 325 MG PO TABS: 325.0000 mg | ORAL_TABLET | Freq: Four times a day (QID) | ORAL | Status: DC | PRN

## 2018-07-19 SURGICAL SUPPLY — 47 items
BANDAGE ACE 4X5 VEL STRL LF (GAUZE/BANDAGES/DRESSINGS) ×6 IMPLANT
BANDAGE ACE 6X5 VEL STRL LF (GAUZE/BANDAGES/DRESSINGS) ×3 IMPLANT
BLADE SURG SZ10 CARB STEEL (BLADE) ×6 IMPLANT
BNDG COHESIVE 4X5 TAN STRL (GAUZE/BANDAGES/DRESSINGS) ×3 IMPLANT
BNDG ESMARK 6X12 TAN STRL LF (GAUZE/BANDAGES/DRESSINGS) ×3 IMPLANT
BNDG PLASTER FAST 4X5 WHT LF (CAST SUPPLIES) ×12 IMPLANT
BNDG PLSTR 5X4 FST ST WHT LF (CAST SUPPLIES) ×4
CANISTER SUCT 1200ML W/VALVE (MISCELLANEOUS) ×3 IMPLANT
CHLORAPREP W/TINT 26ML (MISCELLANEOUS) ×6 IMPLANT
COVER WAND RF STERILE (DRAPES) ×3 IMPLANT
CUFF TOURN 24 STER (MISCELLANEOUS) IMPLANT
CUFF TOURN 30 STER DUAL PORT (MISCELLANEOUS) IMPLANT
DRAPE C-ARM XRAY 36X54 (DRAPES) ×3 IMPLANT
DRAPE C-ARMOR (DRAPES) ×3 IMPLANT
DRAPE INCISE IOBAN 66X45 STRL (DRAPES) ×3 IMPLANT
DRAPE U-SHAPE 47X51 STRL (DRAPES) ×3 IMPLANT
ELECT CAUTERY BLADE 6.4 (BLADE) ×3 IMPLANT
ELECT REM PT RETURN 9FT ADLT (ELECTROSURGICAL) ×3
ELECTRODE REM PT RTRN 9FT ADLT (ELECTROSURGICAL) ×1 IMPLANT
FIXATION ZIPTIGHT ANKLE SNDSMS (Ankle) IMPLANT
GAUZE PETRO XEROFOAM 1X8 (MISCELLANEOUS) ×3 IMPLANT
GAUZE SPONGE 4X4 12PLY STRL (GAUZE/BANDAGES/DRESSINGS) ×3 IMPLANT
GAUZE XEROFORM 1X8 LF (GAUZE/BANDAGES/DRESSINGS) ×2 IMPLANT
GLOVE BIO SURGEON STRL SZ8 (GLOVE) ×6 IMPLANT
GLOVE INDICATOR 8.0 STRL GRN (GLOVE) ×3 IMPLANT
GOWN STRL REUS W/ TWL LRG LVL3 (GOWN DISPOSABLE) ×1 IMPLANT
GOWN STRL REUS W/ TWL XL LVL3 (GOWN DISPOSABLE) ×1 IMPLANT
GOWN STRL REUS W/TWL LRG LVL3 (GOWN DISPOSABLE) ×3
GOWN STRL REUS W/TWL XL LVL3 (GOWN DISPOSABLE) ×3
HEMOVAC 400ML (MISCELLANEOUS) ×3
KIT DRAIN HEMOVAC JP 7FR 400ML (MISCELLANEOUS) ×1 IMPLANT
KIT TURNOVER KIT A (KITS) ×3 IMPLANT
LABEL OR SOLS (LABEL) ×3 IMPLANT
NS IRRIG 1000ML POUR BTL (IV SOLUTION) ×3 IMPLANT
PACK EXTREMITY ARMC (MISCELLANEOUS) ×3 IMPLANT
PAD ABD DERMACEA PRESS 5X9 (GAUZE/BANDAGES/DRESSINGS) ×6 IMPLANT
PAD CAST CTTN 4X4 STRL (SOFTGOODS) ×2 IMPLANT
PAD PREP 24X41 OB/GYN DISP (PERSONAL CARE ITEMS) ×3 IMPLANT
PADDING CAST COTTON 4X4 STRL (SOFTGOODS) ×6
SPONGE LAP 18X18 RF (DISPOSABLE) ×3 IMPLANT
STAPLER SKIN PROX 35W (STAPLE) ×3 IMPLANT
STOCKINETTE IMPERV 14X48 (MISCELLANEOUS) ×3 IMPLANT
SUT VIC AB 0 CT1 36 (SUTURE) ×3 IMPLANT
SUT VIC AB 2-0 SH 27 (SUTURE) ×6
SUT VIC AB 2-0 SH 27XBRD (SUTURE) ×2 IMPLANT
SYR 10ML LL (SYRINGE) ×3 IMPLANT
ZIPTIGHT ANKLE SYNODESMOSS FIX (Ankle) ×6 IMPLANT

## 2018-07-19 NOTE — H&P (Signed)
Subjective:  Chief complaint: Left ankle pain.  The patient is a 28 y.o. female who sustained an injury to the left ankle last evening when she apparently slipped in some mud and twisted her ankle awkwardly.  She noticed that it was "pointing out to the side" so she grabbed her foot and put it straight and felt a clunk when she did so.  She presented to the emergency room where x-rays demonstrated a mildly laterally subluxed Maisonneuve fracture dislocation of the ankle.  The patient denies any associated injury associated with the injury.  She did not strike her head or lose consciousness.  She also denies any light-headedness, dizziness, chest pain, or shortness of breath which might have contributed to the injury.  Patient Active Problem List   Diagnosis Date Noted  . Ankle fracture, left 07/19/2018  . Contact dermatitis 12/17/2017  . Nephrolithiasis 03/25/2017  . HTN (hypertension) 03/25/2017   Past Medical History:  Diagnosis Date  . Anemia 2011   postop anemia after CS  . Headache(784.0)   . Hypertension   . Kidney stones   . Palpitations    per pt due to stress    Past Surgical History:  Procedure Laterality Date  . CESAREAN SECTION    . CESAREAN SECTION WITH BILATERAL TUBAL LIGATION Bilateral 08/13/2012   Procedure: CESAREAN SECTION WITH BILATERAL TUBAL LIGATION;  Surgeon: Margarette Asal, MD;  Location: [facility];  Service: Obstetrics;  Laterality: Bilateral;  Repeat edc 08/09/12  . WISDOM TOOTH EXTRACTION      Medications Prior to Admission  Medication Sig Dispense Refill Last Dose  . ALPRAZolam (XANAX) 1 MG tablet Take 1 mg by mouth daily.   07/18/2018 at 1530  . amLODipine (NORVASC) 5 MG tablet Take 1 tablet (5 mg total) by mouth daily. 90 tablet 0 07/19/2018 at 0930   Allergies  Allergen Reactions  . Morphine And Related Shortness Of Breath    Pt. States she can tolerate Dilaudid & has had it before.  Marland Kitchen Percocet [Oxycodone-Acetaminophen] Hives    Social History    Tobacco Use  . Smoking status: Current Every Day Smoker    Packs/day: 1.00    Types: Cigarettes    Start date: 2004  . Smokeless tobacco: Never Used  Substance Use Topics  . Alcohol use: No    Family History  Problem Relation Age of Onset  . Hypertension Mother   . Mood Disorder Mother   . Lung cancer Mother        tobacco use  . Colon cancer Mother 78  . Colon cancer Father 41  . Lung cancer Maternal Grandmother   . Other Neg Hx      Review of Systems: As noted above. The patient denies any chest pain, shortness of breath, nausea, vomiting, diarrhea, constipation, belly pain, blood in his/her stool, or burning with urination.  Objective: Temp:  [97.5 F (36.4 C)-98.5 F (36.9 C)] 98.5 F (36.9 C) (02/23 0730) Pulse Rate:  [66-90] 72 (02/23 0730) Resp:  [18-19] 18 (02/23 0730) BP: (120-160)/(86-100) 138/91 (02/23 0730) SpO2:  [95 %-100 %] 95 % (02/23 0730) Weight:  [79.4 kg] 79.4 kg (02/22 2234)  Physical Exam: General:  Alert, no acute distress Psychiatric:  Patient is competent for consent with normal mood and affect Cardiovascular:  RRR  Respiratory:  Clear to auscultation. No wheezing. Non-labored breathing GI:  Abdomen is soft and non-tender Skin:  No lesions in the area of chief complaint Neurologic:  Sensation intact distally Lymphatic:  No axillary or cervical lymphadenopathy  Orthopedic Exam:  Orthopedic examination is limited to the left lower leg and foot.  The patient's left lower leg is in a posterior splint with a sugar tong supplement.  The splint appears to be in good condition.  The skin is intact at the proximal distal margins of the splint.  The patient is able to dorsiflex and plantarflex her toes.  Sensation is intact light touch to all distributions.  She has excellent capillary refill to all digits.  Imaging Review: Recent x-rays of the left ankle are available for review and have been reviewed by myself.  These films demonstrate a  Maisonneuve fracture with lateral subluxation of the ankle mortise.  There is a midshaft fibular fracture and an apparent deltoid ligament disruption.  No significant degenerative changes are noted.  No lytic lesions are identified.  Assessment: Closed Maisonneuve fracture dislocation, left ankle.  Plan: The treatment options, including both surgical and nonsurgical choices, have been discussed in detail with the patient and her family. The risks (including bleeding, infection, nerve and/or blood vessel injury, persistent or recurrent pain, loosening or failure of the components, leg length inequality, dislocation, need for further surgery, blood clots, strokes, heart attacks or arrhythmias, pneumonia, etc.) and benefits of the surgical procedure were discussed. The patient states her understanding and agrees to proceed. A formal written consent has been obtained.

## 2018-07-19 NOTE — Progress Notes (Signed)
Called and spoke to Dr. Ronelle Nigh regarding a lump that patient has at the site of spinal.There is no redness or warmth in the area.  Was told by Dr that it is probably a hematoma that developed and it should be ok.

## 2018-07-19 NOTE — ED Provider Notes (Signed)
[facility] Emergency Department Provider Note  ____________________________________________   First MD Initiated Contact with Patient 07/18/18 2341     (approximate)  I have reviewed the triage vital signs and the nursing notes.   HISTORY  Chief Complaint Ankle Injury    HPI Dorothy Schwartz is a 28 y.o. female reports a history most notable for hypertension and anxiety.  She presents for evaluation of pain in her left ankle after a fall.  She says that she slipped in the mud and twisted her leg when she fell.  She saw immediately that her foot "was going the wrong direction" and she jerked it back into place.  She has pain going from her ankle up to the middle of her lower leg but the pain is only mild at this time, severe when she moves around.  The episode happened acutely and she did not sustain other injuries.  She specifically denies headache, neck pain, injuries to her arms, chest pain, shortness of breath, nausea, vomiting, and abdominal pain.  The pain in her ankle and lower leg is sharp and throbbing.  She is on no blood thinners and denies use of any illegal drugs.  She smokes tobacco every day.      Past Medical History:  Diagnosis Date  . Anemia 2011   postop anemia after CS  . Headache(784.0)   . Hypertension   . Kidney stones   . Palpitations    per pt due to stress    Patient Active Problem List   Diagnosis Date Noted  . Contact dermatitis 12/17/2017  . Nephrolithiasis 03/25/2017  . HTN (hypertension) 03/25/2017    Past Surgical History:  Procedure Laterality Date  . CESAREAN SECTION    . CESAREAN SECTION WITH BILATERAL TUBAL LIGATION Bilateral 08/13/2012   Procedure: CESAREAN SECTION WITH BILATERAL TUBAL LIGATION;  Surgeon: Margarette Asal, MD;  Location: [facility];  Service: Obstetrics;  Laterality: Bilateral;  Repeat edc 08/09/12  . WISDOM TOOTH EXTRACTION      Prior to Admission medications   Medication Sig Start Date End  Date Taking? Authorizing Provider  ALPRAZolam Duanne Moron) 1 MG tablet Take 1 mg by mouth daily.   Yes [provider]  amLODipine (NORVASC) 5 MG tablet Take 1 tablet (5 mg total) by mouth daily. 12/17/17  Yes Diallo, Earna Coder, MD    Allergies Morphine and related and Percocet [oxycodone-acetaminophen]  Family History  Problem Relation Age of Onset  . Hypertension Mother   . Mood Disorder Mother   . Lung cancer Mother        tobacco use  . Colon cancer Mother 52  . Colon cancer Father 64  . Lung cancer Maternal Grandmother   . Other Neg Hx     Social History Social History   Tobacco Use  . Smoking status: Current Every Day Smoker    Packs/day: 1.00    Types: Cigarettes    Start date: 2004  . Smokeless tobacco: Never Used  Substance Use Topics  . Alcohol use: No  . Drug use: No    Review of Systems Constitutional: No fever/chills Eyes: No visual changes. ENT: No sore throat. Cardiovascular: Denies chest pain. Respiratory: Denies shortness of breath. Gastrointestinal: No abdominal pain.  No nausea, no vomiting.  No diarrhea.  No constipation. Genitourinary: Negative for dysuria. Musculoskeletal: Pain in left ankle and left lower leg as described above. Integumentary: Negative for rash. Neurological: Negative for headaches, focal weakness or numbness.   ____________________________________________  PHYSICAL EXAM:  VITAL SIGNS: ED Triage Vitals [07/18/18 2234]  Enc Vitals Group     BP (!) 160/100     Pulse Rate 80     Resp 18     Temp (!) 97.5 F (36.4 C)     Temp src      SpO2 100 %     Weight 79.4 kg (175 lb)     Height 1.6 m (5\' 3" )     Head Circumference      Peak Flow      Pain Score 9     Pain Loc      Pain Edu?      Excl. in [place]?     Constitutional: Alert and oriented. Well appearing and in no acute distress. Eyes: Conjunctivae are normal.  Head: Atraumatic. Nose: No congestion/rhinnorhea. Mouth/Throat: Mucous membranes are  moist. Neck: No stridor.  No meningeal signs.   Cardiovascular: Normal rate, regular rhythm. Good peripheral circulation. Grossly normal heart sounds. Respiratory: Normal respiratory effort.  No retractions. Lungs CTAB. Gastrointestinal: Soft and nontender. No distention.  Musculoskeletal: There is no obvious deformity to the left lower leg except for the ankle which has ecchymosis and swelling circumferentially but most notable on the medial malleolus.  She is tender to palpation throughout the ankle and the left lower leg up to the midshaft of the tibia/fibula.  The knee is not tender to palpation nor with gentle flexion and extension of the knee and she has no tenderness to palpation of the distal femur.  She is able to move her toes and her foot without any difficulty and reports no subjective sensory loss.  She has an easily palpable distal pulse. Neurologic:  Normal speech and language. No gross focal neurologic deficits are appreciated.  Skin:  Skin is warm, dry and intact. No rash noted. *Psychiatric: Mood and affect are normal. Speech and behavior are normal.**}  ____________________________________________   LABS (all labs ordered are listed, but only abnormal results are displayed)  Labs Reviewed  CBC WITH DIFFERENTIAL/PLATELET - Abnormal; Notable for the following components:      Result Value   WBC 12.3 (*)    Hemoglobin 15.1 (*)    Neutro Abs 10.4 (*)    All other components within normal limits  BASIC METABOLIC PANEL - Abnormal; Notable for the following components:   Potassium 3.2 (*)    Glucose, Bld 108 (*)    All other components within normal limits  HCG, QUANTITATIVE, PREGNANCY  PROTIME-INR  TYPE AND SCREEN   ____________________________________________  EKG  None - EKG not ordered by ED physician ____________________________________________  RADIOLOGY Ursula Alert, personally viewed and evaluated these images (plain radiographs) as part of my medical  decision making, as well as reviewing the written report by the radiologist.  ED MD interpretation: Mid fibular shaft fracture with widened distal tibiofibular joint suggesting a syndesmosis injury and a widening of a ankle mortise.  Official radiology report(s): Dg Tibia/fibula Left  Result Date: 07/18/2018 CLINICAL DATA:  28 y/o F; fall with ankle injury. Pain to mid shaft of left tibia and fibula. EXAM: LEFT ANKLE COMPLETE - 3+ VIEW; LEFT TIBIA AND FIBULA - 2 VIEW COMPARISON:  None. FINDINGS: Left ankle: Acute oblique mid fibula shaft fracture with 1/4 shaft's with displacement. Widening of distal tibiofibular joint. Widening of the medial ankle mortise. Tiny bony density inferior to the medial malleolus, likely avulsion fracture. Talar dome is intact. Left tibia and fibula: Acute oblique mid fibula shaft fracture  with 1/4 shaft's with displacement. Widening of distal tibiofibular joint. Widening of the medial ankle mortise. Tiny bony density inferior to the medial malleolus, likely avulsion fracture. Talar dome is intact. IMPRESSION: 1. Acute oblique mid fibula shaft fracture with 1/4 shaft's with displacement. 2. Widening of distal tibiofibular joint, likely syndesmosis injury. 3. Widening of the medial ankle mortise indicating joint instability. 4. Tiny bony density inferior to the medial malleolus, likely avulsion fracture. Electronically Signed   By: Kristine Garbe M.D.   On: 07/18/2018 23:40   Dg Ankle Complete Left  Result Date: 07/18/2018 CLINICAL DATA:  28 y/o F; fall with ankle injury. Pain to mid shaft of left tibia and fibula. EXAM: LEFT ANKLE COMPLETE - 3+ VIEW; LEFT TIBIA AND FIBULA - 2 VIEW COMPARISON:  None. FINDINGS: Left ankle: Acute oblique mid fibula shaft fracture with 1/4 shaft's with displacement. Widening of distal tibiofibular joint. Widening of the medial ankle mortise. Tiny bony density inferior to the medial malleolus, likely avulsion fracture. Talar dome is  intact. Left tibia and fibula: Acute oblique mid fibula shaft fracture with 1/4 shaft's with displacement. Widening of distal tibiofibular joint. Widening of the medial ankle mortise. Tiny bony density inferior to the medial malleolus, likely avulsion fracture. Talar dome is intact. IMPRESSION: 1. Acute oblique mid fibula shaft fracture with 1/4 shaft's with displacement. 2. Widening of distal tibiofibular joint, likely syndesmosis injury. 3. Widening of the medial ankle mortise indicating joint instability. 4. Tiny bony density inferior to the medial malleolus, likely avulsion fracture. Electronically Signed   By: Kristine Garbe M.D.   On: 07/18/2018 23:40    ____________________________________________   PROCEDURES  Critical Care performed: No   Procedure(s) performed:   .Splint Application Date/Time: 09/17/5359 2:09 AM Performed by: Hinda Kehr, MD Authorized by: Hinda Kehr, MD   Consent:    Consent obtained:  Verbal   Consent given by:  Patient   Risks discussed:  Discoloration, numbness, pain and swelling   Alternatives discussed:  No treatment Procedure details:    Laterality:  Left   Location:  Leg   Leg:  L lower leg   Strapping: yes     Splint type:  Short leg   Supplies:  Ortho-Glass Post-procedure details:    Pain:  Unchanged   Sensation:  Normal   Patient tolerance of procedure:  Tolerated well, no immediate complications     ____________________________________________   INITIAL IMPRESSION / MDM / ASSESSMENT AND PLAN / ED COURSE  As part of my medical decision making, I reviewed the following data within the [facility] notes reviewed and incorporated, Labs reviewed , Old chart reviewed, Radiograph reviewed , A consult was requested and obtained from this/these consultant(s) Orthopedics and Notes from prior ED visits       Differential diagnosis includes, but is not limited to, ankle fracture, tibia or fibula fracture,  joint dislocation, tibial plateau fracture or femur fracture.  The patient's x-rays demonstrate a fibula fracture, syndesmosis injury, and ankle instability.  She has no clinical signs of femur fracture.  I discussed the case by phone with Dr. Roland Rack who reviewed the images and will admit the patient for surgery later today.  He is putting in orders and asked [place] to place the patient in a posterior short leg splint with strapping to support the ankle.  I updated the patient who is very nervous about the surgery and I have ordered a as needed dose of Ativan 1 mg IV for her nerves  versus fentanyl 50 mcg IV for pain for when her splint is placed.  She has been asked to remain n.p.o.  Clinical Course as of Jul 19 208  Sun Jul 19, 2018  0209 Labs generally reassuring, no significant abnormalities.  Potassium slightly low, will replete with IV potassium over the next few hours since the patient is NPO   [CF]    Clinical Course User Index [CF] Hinda Kehr, MD    ____________________________________________  FINAL CLINICAL IMPRESSION(S) / ED DIAGNOSES  Final diagnoses:  Ankle syndesmosis disruption, left, initial encounter  Closed fracture of shaft of left fibula, unspecified fracture morphology, initial encounter  Hypokalemia     MEDICATIONS GIVEN DURING THIS VISIT:  Medications  0.9 %  sodium chloride infusion ( Intravenous New Bag/Given 07/19/18 0159)  ceFAZolin (ANCEF) IVPB 2g/100 mL premix (has no administration in time range)  LORazepam (ATIVAN) injection 1 mg (has no administration in time range)  ondansetron (ZOFRAN) injection 4 mg (has no administration in time range)  potassium chloride 10 mEq in 100 mL IVPB (has no administration in time range)  fentaNYL (SUBLIMAZE) injection 50 mcg (50 mcg Intravenous Given 07/19/18 0144)     ED Discharge Orders    None       Note:  This document was prepared using Dragon voice recognition software and may include unintentional dictation  errors.   Hinda Kehr, MD 07/19/18 [phone]

## 2018-07-19 NOTE — Plan of Care (Signed)

## 2018-07-19 NOTE — Op Note (Addendum)
07/19/2018  10:53 AM  Patient:   Dorothy Schwartz  Pre-Op Diagnosis:   Closed displaced Maisonneuve fracture, left ankle.  Post-Op Diagnosis:   Same.  Procedure:   Open reduction and internal fixation of Maisonneuve fracture with syndesmotic stabilization, left ankle.  Surgeon:   Pascal Lux, MD  Assistant:   None  Anesthesia:   Spinal  Findings:   As above.  Complications:   None  EBL:   2 cc  Fluids:   1000 cc crystalloid  UOP:   100 cc  TT:   26 min at 250 mmHg  Drains:   None  Closure:   Staples  Implants:   Biomet ZipTights x2  Brief Clinical Note:   The patient is a 28 year old female who sustained the above-noted injury last evening when she apparently slipped and fell in the mud, injuring her left ankle. She was brought to the emergency room where x-rays demonstrated a displaced Maisonneuve fracture of her left ankle. The patient presents at this time for definitive management of his/her injury.  Procedure:   The patient was brought into the operating room. After adequate spinal anesthesia was obtained, the patient was lain in the supine position before a Foley catheter was inserted. The left foot and lower leg were prepped with ChloraPrep solution, then draped sterilely. Preoperative antibiotics were administered. A timeout was performed to verify the appropriate surgical site before the limb was exsanguinated with an Esmarch and the calf tourniquet inflated to 250 mmHg. Laterally, a 2-3 cm incision was made over the lateral aspect of the distal fibula. The incision was carried down through the subcutaneous tissues to expose the lateral aspect of the distal fibula. Under fluoroscopic visualization, two guidewires were passed through the distal fibula and into the distal tibia parallel to the syndesmosis before exiting the medial aspect of the tibia. Each of these guidewires were overreamed using the appropriate cannulated drill before a Zimmer-Biomet ZipTight device  was passed through each of these holes and tightened securely, thereby stabilizing the syndesmosis. The adequacy of hardware position and syndesmotic reduction was verified fluoroscopically in AP and lateral projections and found to be excellent.   The wound was copiously irrigated with sterile saline solution. The subcutaneous tissues were closed in two layers using 2-0 Vicryl interrupted sutures before the skin was closed using staples. A total of 10 cc of 0.5% plain Sensorcaine was injected in and around the incision sites to help with postoperative analgesia. Sterile bulky dressings were applied to the wounds before the patient was placed into a posterior splint with a sugar tong supplement, maintaining the ankle in neutral dorsiflexion. The patient was then awakened and returned to the recovery room in satisfactory condition after tolerating the procedure well.

## 2018-07-19 NOTE — Anesthesia Post-op Follow-up Note (Signed)
Anesthesia QCDR form completed.        

## 2018-07-19 NOTE — Progress Notes (Signed)
15 minute call to floor. 

## 2018-07-19 NOTE — Anesthesia Procedure Notes (Addendum)
Spinal  Patient location during procedure: OR End time: 07/19/2018 9:39 AM Staffing Resident/CRNA: Clinton Sawyer, CRNA Performed: resident/CRNA  Preanesthetic Checklist Completed: patient identified, site marked, surgical consent, pre-op evaluation, timeout performed, IV checked, risks and benefits discussed and monitors and equipment checked Spinal Block Patient position: sitting Prep: ChloraPrep Patient monitoring: heart rate, continuous pulse ox and blood pressure Approach: midline Location: L4-5 Injection technique: single-shot Needle Needle type: Introducer and Pencan  Needle gauge: 24 G Needle length: 9 cm Assessment Sensory level: T10 Additional Notes Negative paresthesia. Negative blood return. Positive free-flowing CSF. Expiration date of kit checked and confirmed. Patient tolerated procedure well, without complications.

## 2018-07-19 NOTE — Transfer of Care (Signed)
Immediate Anesthesia Transfer of Care Note  Patient: Dorothy Schwartz  Procedure(s) Performed: OPEN REDUCTION INTERNAL FIXATION (ORIF) ANKLE FRACTURE (Left )  Patient Location: PACU  Anesthesia Type:Spinal  Level of Consciousness: awake, alert  and oriented  Airway & Oxygen Therapy: Patient Spontanous Breathing and Patient connected to nasal cannula oxygen  Post-op Assessment: Report given to RN and Post -op Vital signs reviewed and stable  Post vital signs: Reviewed and stable  Last Vitals:  Vitals Value Taken Time  BP    Temp    Pulse 59 07/19/2018 10:54 AM  Resp 17 07/19/2018 10:54 AM  SpO2 98 % 07/19/2018 10:54 AM  Vitals shown include unvalidated device data.  Last Pain:  Vitals:   07/19/18 0745  TempSrc:   PainSc: 8       Patients Stated Pain Goal: 4 (23/30/07 6226)  Complications: No apparent anesthesia complications

## 2018-07-19 NOTE — Anesthesia Preprocedure Evaluation (Signed)
Anesthesia Evaluation  Patient identified by MRN, date of birth, ID band Patient awake    Reviewed: Allergy & Precautions, NPO status , Patient's Chart, lab work & pertinent test results  History of Anesthesia Complications Negative for: history of anesthetic complications  Airway Mallampati: III       Dental   Pulmonary neg sleep apnea, neg COPD, Current Smoker,           Cardiovascular hypertension, Pt. on medications (-) Past MI and (-) CHF (-) dysrhythmias (-) Valvular Problems/Murmurs     Neuro/Psych neg Seizures    GI/Hepatic Neg liver ROS, neg GERD  ,  Endo/Other  neg diabetes  Renal/GU Renal disease (stones)     Musculoskeletal   Abdominal   Peds  Hematology   Anesthesia Other Findings   Reproductive/Obstetrics                             Anesthesia Physical Anesthesia Plan  ASA: II and emergent  Anesthesia Plan: Spinal   Post-op Pain Management:    Induction:   PONV Risk Score and Plan:   Airway Management Planned:   Additional Equipment:   Intra-op Plan:   Post-operative Plan:   Informed Consent: I have reviewed the patients History and Physical, chart, labs and discussed the procedure including the risks, benefits and alternatives for the proposed anesthesia with the patient or authorized representative who has indicated his/her understanding and acceptance.       Plan Discussed with:   Anesthesia Plan Comments:         Anesthesia Quick Evaluation

## 2018-07-19 NOTE — Progress Notes (Signed)
Patient eating ice chips.  No acute distress.  Patient ready to see her family and have lunch she states.

## 2018-07-19 NOTE — Progress Notes (Signed)
Left foot elevated on 2 pillows, splint and dressings in place, clean and dry.  Toes pink in color and warm to touch.  Ice placed to area per MD order.

## 2018-07-20 ENCOUNTER — Encounter: Payer: Self-pay | Admitting: Surgery

## 2018-07-20 LAB — BASIC METABOLIC PANEL
Anion gap: 7 (ref 5–15)
BUN: 7 mg/dL (ref 6–20)
CO2: 24 mmol/L (ref 22–32)
Calcium: 8.3 mg/dL — ABNORMAL LOW (ref 8.9–10.3)
Chloride: 108 mmol/L (ref 98–111)
Creatinine, Ser: 0.63 mg/dL (ref 0.44–1.00)
GFR calc non Af Amer: >60 mL/min (ref >60)
Glucose, Bld: 102 mg/dL — ABNORMAL HIGH (ref 70–99)
Potassium: 3.5 mmol/L (ref 3.5–5.1)
Sodium: 139 mmol/L (ref 135–145)

## 2018-07-20 MED ORDER — ASPIRIN EC 325 MG PO TBEC: 325.0000 mg | DELAYED_RELEASE_TABLET | Freq: Every day | ORAL | 0 refills | Status: DC

## 2018-07-20 MED ORDER — HYDROCODONE-ACETAMINOPHEN 5-325 MG PO TABS: 1.0000 | ORAL_TABLET | ORAL | 0 refills | Status: DC | PRN

## 2018-07-20 MED ORDER — TRAMADOL HCL 50 MG PO TABS: 50.0000 mg | ORAL_TABLET | Freq: Four times a day (QID) | ORAL | 0 refills | Status: DC | PRN

## 2018-07-20 NOTE — Evaluation (Signed)
Physical Therapy Evaluation Patient Details Name: Dorothy Schwartz MRN: [id number] DOB: August 10, 1990 Today's Date: 07/20/2018   History of Present Illness  Dorothy Schwartz is a 28yo female who sustained a fall and comes [facility]/22 c ankle fracture and syndesmosis disruption. Lt presents s/p Rt ankle ORIF, is non weight bearing.   Clinical Impression  Pt admitted with above diagnosis. Pt currently with functional limitations due to the deficits listed below (see "PT Problem List"). Upon entry, pt in bed, sister arrives mid session. The pt is awake and agreeable to participate, reports has been AMB independently in room prior to entry. Pt moves well with bed mobility, transfers, AMB, and stairs performance, sister in room for stairs training. Pt has excellent BUE strength. Functional mobility assessment demonstrates increased effort/time requirements, poor tolerance, but no need for physical assistance, whereas the patient performed these at a higher level of independence PTA. Pt will benefit from skilled PT intervention to increase independence and safety with basic mobility in preparation for discharge to the venue listed below.       Follow Up Recommendations Supervision - Intermittent;Follow surgeon's recommendation for DC plan and follow-up therapies    Equipment Recommendations  (youth height rolling walker; standard height is too tall for performance on stairs )    Recommendations for Other Services Rehab consult     Precautions / Restrictions Precautions Precautions: Fall Restrictions Weight Bearing Restrictions: Yes LLE Weight Bearing: Non weight bearing      Mobility  Bed Mobility Overal bed mobility: Independent                Transfers Overall transfer level: Modified independent Equipment used: Rolling walker (2 wheeled)                Ambulation/Gait Ambulation/Gait assistance: Supervision Gait Distance (Feet): 55 Feet Assistive device: Rolling walker (2  wheeled)       General Gait Details: 2-point hop-to gait, maintains NWB on LLE   Stairs Stairs: Yes Stairs assistance: Min guard Stair Management: No rails;Backwards;With walker Number of Stairs: 8 General stair comments: Sister comes along for crutch training  Wheelchair Mobility    Modified Rankin (Stroke Patients Only)       Balance Overall balance assessment: Modified Independent                                           Pertinent Vitals/Pain Pain Assessment: 0-10 Pain Score: 7  Pain Location: Right ankle Pain Intervention(s): Limited activity within patient's tolerance;Monitored during session;Premedicated before session;Repositioned;Patient requesting pain meds-RN notified    Home Living Family/patient expects to be discharged to:: Private residence Living Arrangements: Children(3 children, 57yo, 81yo, 5yo ) Available Help at Discharge: Family(significant other, sister) Type of Home: Mobile home Home Access: Stairs to enter Entrance Stairs-Rails: Can reach both Entrance Stairs-Number of Steps: 4 Home Layout: One level Home Equipment: None      Prior Function Level of Independence: Independent               Hand Dominance        Extremity/Trunk Assessment   Upper Extremity Assessment Upper Extremity Assessment: Overall WFL for tasks assessed            Communication   Communication: No difficulties  Cognition Arousal/Alertness: Awake/alert Behavior During Therapy: WFL for tasks assessed/performed Overall Cognitive Status: Within Functional Limits for tasks assessed  General Comments      Exercises     Assessment/Plan    PT Assessment Patient needs continued PT services  PT Problem List Decreased activity tolerance;Decreased balance;Decreased mobility       PT Treatment Interventions DME instruction;Therapeutic exercise;Gait training;Stair  training;Functional mobility training;Therapeutic activities;Patient/family education    PT Goals (Current goals can be found in the Care Plan section)  Acute Rehab PT Goals Patient Stated Goal: regain independent mobility in home  PT Goal Formulation: With patient Time For Goal Achievement: 07/27/18 Potential to Achieve Goals: Good    Frequency 7X/week   Barriers to discharge        Co-evaluation               AM-PAC PT "6 Clicks" Mobility  Outcome Measure Help needed turning from your back to your side while in a flat bed without using bedrails?: None Help needed moving from lying on your back to sitting on the side of a flat bed without using bedrails?: None Help needed moving to and from a bed to a chair (including a wheelchair)?: None Help needed standing up from a chair using your arms (e.g., wheelchair or bedside chair)?: None Help needed to walk in hospital room?: A Little Help needed climbing 3-5 steps with a railing? : A Little 6 Click Score: 22    End of Session Equipment Utilized During Treatment: Gait belt Activity Tolerance: Patient tolerated treatment well Patient left: with call bell/phone within reach;with family/visitor present(on toilet ) Nurse Communication: Mobility status PT Visit Diagnosis: Other abnormalities of gait and mobility (R26.89);Difficulty in walking, not elsewhere classified (R26.2)    Time: 2831-5176 PT Time Calculation (min) (ACUTE ONLY): 28 min   Charges:   PT Evaluation $PT Eval Low Complexity: 1 Low          10:50 AM, 07/20/18 Etta Grandchild, PT, DPT Physical Therapist - [facility]  [phone] ([place])    , C 07/20/2018, 10:46 AM

## 2018-07-20 NOTE — Progress Notes (Signed)
  Subjective: 1 Day Post-Op Procedure(s) (LRB): OPEN REDUCTION INTERNAL FIXATION (ORIF) ANKLE FRACTURE (Left) Patient reports pain as mild.  Reports more pain in spine where hematoma has formed from the spinal block. Patient is well, and has had no acute complaints or problems Plan is to go Home after hospital stay. Negative for chest pain and shortness of breath Fever: no Gastrointestinal:Negative for nausea and vomiting  Objective: Vital signs in last 24 hours: Temp:  [97.6 F (36.4 C)-98.7 F (37.1 C)] 97.8 F (36.6 C) (02/24 0730) Pulse Rate:  [45-73] 55 (02/24 0730) Resp:  [14-22] 17 (02/24 0730) BP: (99-136)/(65-92) 136/92 (02/24 0730) SpO2:  [97 %-100 %] 99 % (02/24 0730)  Intake/Output from previous day:  Intake/Output Summary (Last 24 hours) at 07/20/2018 0753 Last data filed at 07/20/2018 0002 Gross per 24 hour  Intake 1871.3 ml  Output 102 ml  Net 1769.3 ml    Intake/Output this shift: No intake/output data recorded.  Labs: Recent Labs    07/19/18 0055  HGB 15.1*   Recent Labs    07/19/18 0055  WBC 12.3*  RBC 4.99  HCT 42.8  PLT 342   Recent Labs    07/19/18 0055 07/20/18 0448  NA 140 139  K 3.2* 3.5  CL 105 108  CO2 24 24  BUN 7 7  CREATININE 0.67 0.63  GLUCOSE 108* 102*  CALCIUM 9.1 8.3*   Recent Labs    07/19/18 0055  INR 1.03     EXAM General - Patient is Alert, Appropriate and Oriented Extremity - ABD soft Neurovascular intact Sensation intact distally Incision: dressing C/D/I Dressing/Incision - clean, dry, no drainage Motor Function - intact, moving foot and toes well on exam, intact to light touch to the dorsal and volar aspect of the foot over her toes.  Past Medical History:  Diagnosis Date  . Anemia 2011   postop anemia after CS  . Headache(784.0)   . Hypertension   . Kidney stones   . Palpitations    per pt due to stress    Assessment/Plan: 1 Day Post-Op Procedure(s) (LRB): OPEN REDUCTION INTERNAL FIXATION  (ORIF) ANKLE FRACTURE (Left) Active Problems:   Ankle fracture, left  Estimated body mass index is 31 kg/m as calculated from the following:   Height as of this encounter: 5\' 3"  (1.6 m).   Weight as of this encounter: 79.4 kg. Advance diet Up with therapy D/C IV fluids when tolerating po intake.  Up with therapy today. Patient is passing gas this AM. Plan for discharge home this afternoon. Follow-up in the office with [facility] in 10-14 days for cast placement.  DVT Prophylaxis - Lovenox, Foot Pumps and TED hose Non-weightbearing to the left leg.  Raquel James, PA-C [facility] 07/20/2018, 7:53 AM

## 2018-07-20 NOTE — Discharge Instructions (Signed)
Diet: As you were doing prior to hospitalization   Shower:  May shower but keep the wounds dry, use an occlusive plastic wrap, NO SOAKING IN TUB.  If the bandage gets wet, change with a clean dry gauze.  Dressing:  Remain in splint until first appointment.  Activity:  Increase activity slowly as tolerated, but follow the weight bearing instructions below.  No lifting or driving for 6 weeks.  Weight Bearing:   Non-weightbearing to the left leg.  Blood Clot Prevention: Take 1 325mg  aspirin daily until first post-op appointment.  To prevent constipation: you may use a stool softener such as -  Colace (over the counter) 100 mg by mouth twice a day  Drink plenty of fluids (prune juice may be helpful) and high fiber foods Miralax (over the counter) for constipation as needed.    Itching:  If you experience itching with your medications, try taking only a single pain pill, or even half a pain pill at a time.  You may take up to 10 pain pills per day, and you can also use benadryl over the counter for itching or also to help with sleep.   Precautions:  If you experience chest pain or shortness of breath - call 911 immediately for transfer to the hospital emergency department!!  If you develop a fever greater that 101 F, purulent drainage from wound, increased redness or drainage from wound, or calf pain-Call [facility]                                              Follow- Up Appointment:  Please call for an appointment to be seen in 2 weeks at [facility]

## 2018-07-20 NOTE — Anesthesia Postprocedure Evaluation (Signed)
Anesthesia Post Note  Patient: Dorothy Schwartz  Procedure(s) Performed: OPEN REDUCTION INTERNAL FIXATION (ORIF) ANKLE FRACTURE (Left )  Patient location during evaluation: Nursing Unit Anesthesia Type: Spinal Level of consciousness: awake, awake and alert and oriented Pain management: pain level controlled Vital Signs Assessment: post-procedure vital signs reviewed and stable Respiratory status: spontaneous breathing, nonlabored ventilation and respiratory function stable Cardiovascular status: blood pressure returned to baseline and stable Comments: Pt c/o back pain at insertion site. Some swelling noted at site. Denies numbness and tingling in BLE.     Last Vitals:  Vitals:   07/19/18 2312 07/20/18 0441  BP: 118/72 126/89  Pulse: (!) 58 (!) 57  Resp: 18 18  Temp: 37.1 C 36.6 C  SpO2: 99% 98%    Last Pain:  Vitals:   07/20/18 0441  TempSrc: Oral  PainSc:                  Johnna Acosta

## 2018-07-20 NOTE — Care Management (Signed)
Was notified by PT that the patient will need to have a youth walker for home, notified Brad at [facility] of the need

## 2018-07-20 NOTE — Care Management Note (Signed)
Case Management Note  Patient Details  Name: Dorothy Schwartz MRN: [id number] Date of Birth: 1990-08-18  Subjective/Objective:                  Met with the patient and her sister, provided the [facility] list per CMS.GOV, patient declines [place] services, Have asked Brad with [facility] for youth size rolling walker.   No CM sercies needed   Action/Plan: [facility] list provided to the patient per CMS.gov   Expected Discharge Date:  07/20/18               Expected Discharge Plan:     In-House Referral:     Discharge planning Services  CM Consult  Post Acute Care Choice:  Home Health Choice offered to:  Patient  DME Arranged:  [facility] Rile youth DME Agency:  [place]:    [facility] Agency:     Status of Service:     If discussed at [employer], dates discussed:    Additional Comments:  Su Hilt, RN 07/20/2018, 11:59 AM

## 2018-07-20 NOTE — Progress Notes (Signed)
Patient is alert and oriented and able to verbalize needs. No complaints of pain at this time. VSS. PIV removed. Printed AVS and rx for Norco, Tramadol and ASA given to patient in discharge packet. All discharge instructions and follow up care gone over with patient and sister at this time. Patient verbalized understanding of all these. No concerns voiced at this time. All belongings packed up. Pt escorted to car by nurse via wc. Sister to transport pt home.   Bethann Punches, RN

## 2018-07-20 NOTE — Discharge Summary (Signed)
Physician Discharge Summary  Patient ID: Dorothy Schwartz MRN: [id number] DOB/AGE: 25-Aug-1990 28 y.o.  Admit date: 07/18/2018 Discharge date: 07/20/2018  Admission Diagnoses:  Hypokalemia [E87.6] Ankle syndesmosis disruption, left, initial encounter [S93.432A] Closed fracture of shaft of left fibula, unspecified fracture morphology, initial encounter [S82.402A] Ankle fracture, left [S82.892A]  Discharge Diagnoses: Patient Active Problem List   Diagnosis Date Noted  . Ankle fracture, left 07/19/2018  . Contact dermatitis 12/17/2017  . Nephrolithiasis 03/25/2017  . HTN (hypertension) 03/25/2017    Past Medical History:  Diagnosis Date  . Anemia 2011   postop anemia after CS  . Headache(784.0)   . Hypertension   . Kidney stones   . Palpitations    per pt due to stress     Transfusion: None.   Consultants (if any):   Discharged Condition: Improved  Hospital Course: Dorothy Schwartz is an 28 y.o. female who was admitted 07/18/2018 with a diagnosis of a closed displaced Maisonneuve fracture of the left ankle and went to the operating room on 07/19/2018 and underwent the above named procedures.    Surgeries: Procedure(s): OPEN REDUCTION INTERNAL FIXATION (ORIF) ANKLE FRACTURE on 07/19/2018 Patient tolerated the surgery well. Taken to PACU where she was stabilized and then transferred to the orthopedic floor.  Started on Lovenox 40mg  q 24 hrs. Foot pumps applied bilaterally at 80 mm. Heels elevated on bed with rolled towels. No evidence of DVT. Negative [place]. Physical therapy started on day #1 for gait training and transfer. OT started day #1 for ADL and assisted devices.  Patient's IV was removed on POD1.  Foley removed shortly after surgery.  Implants: [employer] ZipTight device x2  She was given perioperative antibiotics:  Anti-infectives (From admission, onward)   Start     Dose/Rate Route Frequency Ordered Stop   07/19/18 1600  ceFAZolin (ANCEF) IVPB 2g/100 mL premix   Status:  Discontinued     2 g 200 mL/hr over 30 Minutes Intravenous Every 6 hours 07/19/18 1158 07/19/18 1527   07/19/18 1600  ceFAZolin (ANCEF) 2 g in dextrose 5 % 100 mL IVPB     2 g 240 mL/hr over 30 Minutes Intravenous Every 6 hours 07/19/18 1527 07/20/18 0648   07/19/18 1130  ceFAZolin (ANCEF) IVPB 2g/100 mL premix  Status:  Discontinued    Note to Pharmacy:  In OR prior to surgery.   2 g 200 mL/hr over 30 Minutes Intravenous  Once 07/19/18 0034 07/19/18 1151    .  She was given sequential compression devices, early ambulation, and Lovenox for DVT prophylaxis.  She benefited maximally from the hospital stay and there were no complications.    Recent vital signs:  Vitals:   07/20/18 0441 07/20/18 0730  BP: 126/89 (!) 136/92  Pulse: (!) 57 (!) 55  Resp: 18 17  Temp: 97.9 F (36.6 C) 97.8 F (36.6 C)  SpO2: 98% 99%    Recent laboratory studies:  Lab Results  Component Value Date   HGB 15.1 (H) 07/19/2018   HGB 13.3 05/08/2018   HGB 14.3 09/23/2017   Lab Results  Component Value Date   WBC 12.3 (H) 07/19/2018   PLT 342 07/19/2018   Lab Results  Component Value Date   INR 1.03 07/19/2018   Lab Results  Component Value Date   NA 139 07/20/2018   K 3.5 07/20/2018   CL 108 07/20/2018   CO2 24 07/20/2018   BUN 7 07/20/2018   CREATININE 0.63 07/20/2018   GLUCOSE 102 (H) 07/20/2018  Discharge Medications:   Allergies as of 07/20/2018      Reactions   Morphine And Related Shortness Of Breath   Pt. States she can tolerate Dilaudid & has had it before.   Percocet [oxycodone-acetaminophen] Hives      Medication List    TAKE these medications   ALPRAZolam 1 MG tablet Commonly known as:  XANAX Take 1 mg by mouth daily.   amLODipine 5 MG tablet Commonly known as:  NORVASC Take 1 tablet (5 mg total) by mouth daily.   aspirin EC 325 MG tablet Take 1 tablet (325 mg total) by mouth daily.   HYDROcodone-acetaminophen 5-325 MG tablet Commonly known as:   NORCO/VICODIN Take 1-2 tablets by mouth every 4 (four) hours as needed for moderate pain or severe pain.   traMADol 50 MG tablet Commonly known as:  ULTRAM Take 1 tablet (50 mg total) by mouth every 6 (six) hours as needed for moderate pain.            Durable Medical Equipment  (From admission, onward)         Start     Ordered   07/19/18 1159  DME Walker rolling  Once    Question:  Patient needs a walker to treat with the following condition  Answer:  Ankle fracture, left   07/19/18 1158   07/19/18 1159  DME 3 n 1  Once     07/19/18 1158   07/19/18 1159  DME Bedside commode  Once    Question:  Patient needs a bedside commode to treat with the following condition  Answer:  Ankle fracture, left   07/19/18 1158          Diagnostic Studies: Dg Tibia/fibula Left  Result Date: 07/18/2018 CLINICAL DATA:  28 y/o F; fall with ankle injury. Pain to mid shaft of left tibia and fibula. EXAM: LEFT ANKLE COMPLETE - 3+ VIEW; LEFT TIBIA AND FIBULA - 2 VIEW COMPARISON:  None. FINDINGS: Left ankle: Acute oblique mid fibula shaft fracture with 1/4 shaft's with displacement. Widening of distal tibiofibular joint. Widening of the medial ankle mortise. Tiny bony density inferior to the medial malleolus, likely avulsion fracture. Talar dome is intact. Left tibia and fibula: Acute oblique mid fibula shaft fracture with 1/4 shaft's with displacement. Widening of distal tibiofibular joint. Widening of the medial ankle mortise. Tiny bony density inferior to the medial malleolus, likely avulsion fracture. Talar dome is intact. IMPRESSION: 1. Acute oblique mid fibula shaft fracture with 1/4 shaft's with displacement. 2. Widening of distal tibiofibular joint, likely syndesmosis injury. 3. Widening of the medial ankle mortise indicating joint instability. 4. Tiny bony density inferior to the medial malleolus, likely avulsion fracture. Electronically Signed   By: Kristine Garbe M.D.   On: 07/18/2018  23:40   Dg Ankle Complete Left  Result Date: 07/19/2018 CLINICAL DATA:  Left ankle ORIF. EXAM: DG C-ARM 61-120 MIN; LEFT ANKLE COMPLETE - 3+ VIEW COMPARISON:  Left ankle x-rays from yesterday. FLUOROSCOPY TIME:  13 seconds. C-arm fluoroscopic images were obtained intraoperatively and submitted for post operative interpretation. FINDINGS: AP and lateral intraoperative fluoroscopic images demonstrate interval distal tibiofibular syndesmotic repair with suture-button device. Alignment is now anatomic. IMPRESSION: Intraoperative fluoroscopic guidance for left ankle ORIF. Electronically Signed   By: Titus Dubin M.D.   On: 07/19/2018 11:15   Dg Ankle Complete Left  Result Date: 07/18/2018 CLINICAL DATA:  28 y/o F; fall with ankle injury. Pain to mid shaft of left tibia and fibula. EXAM:  LEFT ANKLE COMPLETE - 3+ VIEW; LEFT TIBIA AND FIBULA - 2 VIEW COMPARISON:  None. FINDINGS: Left ankle: Acute oblique mid fibula shaft fracture with 1/4 shaft's with displacement. Widening of distal tibiofibular joint. Widening of the medial ankle mortise. Tiny bony density inferior to the medial malleolus, likely avulsion fracture. Talar dome is intact. Left tibia and fibula: Acute oblique mid fibula shaft fracture with 1/4 shaft's with displacement. Widening of distal tibiofibular joint. Widening of the medial ankle mortise. Tiny bony density inferior to the medial malleolus, likely avulsion fracture. Talar dome is intact. IMPRESSION: 1. Acute oblique mid fibula shaft fracture with 1/4 shaft's with displacement. 2. Widening of distal tibiofibular joint, likely syndesmosis injury. 3. Widening of the medial ankle mortise indicating joint instability. 4. Tiny bony density inferior to the medial malleolus, likely avulsion fracture. Electronically Signed   By: Kristine Garbe M.D.   On: 07/18/2018 23:40   Dg C-arm 1-60 Min  Result Date: 07/19/2018 CLINICAL DATA:  Left ankle ORIF. EXAM: DG C-ARM 61-120 MIN; LEFT ANKLE  COMPLETE - 3+ VIEW COMPARISON:  Left ankle x-rays from yesterday. FLUOROSCOPY TIME:  13 seconds. C-arm fluoroscopic images were obtained intraoperatively and submitted for post operative interpretation. FINDINGS: AP and lateral intraoperative fluoroscopic images demonstrate interval distal tibiofibular syndesmotic repair with suture-button device. Alignment is now anatomic. IMPRESSION: Intraoperative fluoroscopic guidance for left ankle ORIF. Electronically Signed   By: Titus Dubin M.D.   On: 07/19/2018 11:15   Disposition: Plan for discharge home this afternoon following PT sessions.  Follow-up Information    Lattie Corns, PA-C Follow up in 14 day(s).   Specialty:  Physician Assistant Why:  Staple removal and cast placement. Contact information: [place] [address] [phone]          Signed: Judson Roch PA-C 07/20/2018, 7:57 AM

## 2018-07-30 MED FILL — HYDROCODON-APAP 5-325: 5-325 | 4 days supply | Qty: 50 | Fill #0

## 2018-09-14 ENCOUNTER — Telehealth (INDEPENDENT_AMBULATORY_CARE_PROVIDER_SITE_OTHER): Payer: Medicaid Other | Admitting: Family Medicine

## 2018-09-14 ENCOUNTER — Other Ambulatory Visit: Payer: Self-pay

## 2018-09-14 DIAGNOSIS — I10 Essential (primary) hypertension: Secondary | ICD-10-CM

## 2018-09-14 MED FILL — AMLODIPINE BESYLATE 5 MG TA: 5 | 30 days supply | Qty: 30 | Fill #0

## 2018-09-14 NOTE — Assessment & Plan Note (Signed)
Not controlled.  Asymptomatic   She will start amlodipine 5 mg tabs today.  Has blood pressure cuff at home and will monitor blood pressure twice a day.  If in 7-10 days still > 140/90 will start taking 10 mg daily.   Recent bmet in Feb normal

## 2018-09-14 NOTE — Progress Notes (Signed)
[place] Telemedicine Visit  Patient consented to have virtual visit. Method of visit: Video  Encounter participants: Patient: Dorothy Schwartz - located at home Provider: Lind Covert - located at office Others (if applicable): no  Chief Complaint: high blood pressure  HPI:  Has history of hypertension.  Out of amlodipine for several weeks.  Recent blood pressure checked was 162/119. Has gotten an emergency refill of amlodipine and will pick up today. Feels well no chest pain or headache   Only other medication is xanax as needed Recent leg fracture 4/6  ROS: per HPI  Pertinent PMHx: see above  Exam:  Respiratory: normal speech pattern Psych:  Cognition and judgment appear intact. Alert, communicative  and cooperative with normal attention span and concentration. No apparent delusions, illusions, hallucinations Appears healthy   Assessment/Plan:  No problem-specific Assessment & Plan notes found for this encounter.    Time spent during visit with patient: 10 minutes total

## 2018-09-19 ENCOUNTER — Ambulatory Visit (HOSPITAL_COMMUNITY)
Admission: EM | Admit: 2018-09-19 | Discharge: 2018-09-19 | Disposition: A | Discharge status: Home / Self Care | Payer: Medicaid Other | Attending: Family Medicine | Admitting: Family Medicine

## 2018-09-19 ENCOUNTER — Encounter (HOSPITAL_COMMUNITY): Payer: Self-pay

## 2018-09-19 ENCOUNTER — Other Ambulatory Visit: Payer: Self-pay

## 2018-09-19 DIAGNOSIS — B373 Candidiasis of vulva and vagina: Secondary | ICD-10-CM | POA: Diagnosis present

## 2018-09-19 DIAGNOSIS — B3731 Acute candidiasis of vulva and vagina: Secondary | ICD-10-CM

## 2018-09-19 DIAGNOSIS — N898 Other specified noninflammatory disorders of vagina: Secondary | ICD-10-CM | POA: Diagnosis not present

## 2018-09-19 MED ORDER — FLUCONAZOLE 150 MG PO TABS: ORAL_TABLET | ORAL | 0 refills | Status: DC

## 2018-09-19 NOTE — ED Triage Notes (Signed)
Pt cc thinks she has a yeast infection. Pt states she tried Systems analyst and it didn't work. Pt states she has some swelling in the vaginal area. This started yesterday.

## 2018-09-19 NOTE — ED Provider Notes (Signed)
[facility]   [id number] 09/19/18 Arrival Time: 1112  ASSESSMENT & PLAN:  1. Vaginal itching   2. Vaginal yeast infection    Meds ordered this encounter  Medications  . fluconazole (DIFLUCAN) 150 MG tablet    Sig: Take one tablet by mouth as a single dose. May repeat in 3 days if symptoms persist.    Dispense:  2 tablet    Refill:  0   Pending: Labs Reviewed  CERVICOVAGINAL ANCILLARY ONLY   Will notify of any positive results. Instructed to refrain from sexual activity for at least seven days.  Reviewed expectations re: course of current medical issues. Questions answered. Outlined signs and symptoms indicating need for more acute intervention. Patient verbalized understanding. After Visit Summary given.   SUBJECTIVE:  Dorothy Schwartz is a 28 y.o. female who presents with complaint of vaginal itching. Onset abrupt. First noticed yesterday. No vaginal discharge reported; "but hard to say since I was using Monistat". No change in symptoms after Monistat use. Urinary symptoms: none. Afebrile. No abdominal or pelvic pain. No n/v. No rashes or lesions. Sexually active with single female partner.  Patient's last menstrual period was 08/12/2018.  ROS: As per HPI.  OBJECTIVE:  Vitals:   09/19/18 1125 09/19/18 1128  BP: (!) 135/97   Pulse: 83   Resp: 18   Temp: 99 F (37.2 C)   TempSrc: Oral   SpO2: 100%   Weight:  79.4 kg    General appearance: alert, cooperative, appears stated age and no distress Throat: lips, mucosa, and tongue normal; teeth and gums normal CV: RRR Lungs: CTAB Back: no CVA tenderness; FROM at waist Abdomen: soft, non-tender GU: deferred Skin: warm and dry Psychological: alert and cooperative; normal mood and affect.   Allergies  Allergen Reactions  . Morphine And Related Shortness Of Breath    Pt. States she can tolerate Dilaudid & has had it before.  Marland Kitchen Percocet [Oxycodone-Acetaminophen] Hives    Past Medical History:  Diagnosis  Date  . Anemia 2011   postop anemia after CS  . Headache(784.0)   . Hypertension   . Kidney stones   . Palpitations    per pt due to stress   Family History  Problem Relation Age of Onset  . Hypertension Mother   . Mood Disorder Mother   . Lung cancer Mother        tobacco use  . Colon cancer Mother 73  . Colon cancer Father 47  . Lung cancer Maternal Grandmother   . Other Neg Hx    Social History   Socioeconomic History  . Marital status: Single    Spouse name: Not on file  . Number of children: Not on file  . Years of education: Not on file  . Highest education level: Not on file  Occupational History  . Not on file  Social Needs  . Financial resource strain: Not on file  . Food insecurity:    Worry: Not on file    Inability: Not on file  . Transportation needs:    Medical: Not on file    Non-medical: Not on file  Tobacco Use  . Smoking status: Current Every Day Smoker    Packs/day: 1.00    Types: Cigarettes    Start date: 2004  . Smokeless tobacco: Never Used  Substance and Sexual Activity  . Alcohol use: No  . Drug use: No  . Sexual activity: Yes    Birth control/protection: Surgical  Lifestyle  .  Physical activity:    Days per week: Not on file    Minutes per session: Not on file  . Stress: Not on file  Relationships  . Social connections:    Talks on phone: Not on file    Gets together: Not on file    Attends religious service: Not on file    Active member of club or organization: Not on file    Attends meetings of clubs or organizations: Not on file    Relationship status: Not on file  . Intimate partner violence:    Fear of current or ex partner: Not on file    Emotionally abused: Not on file    Physically abused: Not on file    Forced sexual activity: Not on file  Other Topics Concern  . Not on file  Social History Narrative   Part time at [employer].    Lives with sons, Mabeline Caras. 3 dogs           Vanessa Kick, MD 09/19/18 1341

## 2018-09-21 LAB — CERVICOVAGINAL ANCILLARY ONLY
Bacterial vaginitis: POSITIVE — AB
Candida vaginitis: POSITIVE — AB
Chlamydia: NEGATIVE
Neisseria Gonorrhea: NEGATIVE
Trichomonas: NEGATIVE

## 2018-09-24 ENCOUNTER — Telehealth (HOSPITAL_COMMUNITY): Payer: Self-pay | Admitting: Emergency Medicine

## 2018-09-24 MED ORDER — METRONIDAZOLE 500 MG PO TABS: 500.0000 mg | ORAL_TABLET | Freq: Two times a day (BID) | ORAL | 0 refills | Status: AC

## 2018-09-24 NOTE — Telephone Encounter (Signed)
Patient contacted and made aware of all results, all questions answered.   

## 2018-09-24 NOTE — Telephone Encounter (Signed)
Candida (yeast) is positive.  Prescription for fluconazole was given at the urgent care visit.    Bacterial vaginosis is positive. This was not treated at the urgent care visit.  Flagyl 500 mg BID x 7 days #14 no refills sent to patients pharmacy of choice.    Attempted to reach patient. No answer at this time. Voicemail left.

## 2018-10-21 ENCOUNTER — Other Ambulatory Visit: Payer: Self-pay | Admitting: *Deleted

## 2018-10-22 MED ORDER — AMLODIPINE BESYLATE 5 MG PO TABS: 5.0000 mg | ORAL_TABLET | Freq: Every day | ORAL | 0 refills | Status: DC

## 2018-10-22 MED FILL — AMLODIPINE BESYLATE 5 MG TA: 5 | 90 days supply | Qty: 90 | Fill #0

## 2018-10-30 ENCOUNTER — Encounter: Payer: Self-pay | Admitting: Family Medicine

## 2018-10-30 ENCOUNTER — Ambulatory Visit (INDEPENDENT_AMBULATORY_CARE_PROVIDER_SITE_OTHER): Payer: Medicaid Other | Admitting: Family Medicine

## 2018-10-30 ENCOUNTER — Other Ambulatory Visit: Payer: Self-pay

## 2018-10-30 VITALS — BP 128/84 | HR 72 | Wt 165.0 lb

## 2018-10-30 DIAGNOSIS — F17200 Nicotine dependence, unspecified, uncomplicated: Secondary | ICD-10-CM

## 2018-10-30 DIAGNOSIS — I10 Essential (primary) hypertension: Secondary | ICD-10-CM

## 2018-10-30 DIAGNOSIS — N926 Irregular menstruation, unspecified: Secondary | ICD-10-CM | POA: Diagnosis not present

## 2018-10-30 DIAGNOSIS — F419 Anxiety disorder, unspecified: Secondary | ICD-10-CM

## 2018-10-30 DIAGNOSIS — R42 Dizziness and giddiness: Secondary | ICD-10-CM | POA: Diagnosis not present

## 2018-10-30 LAB — POCT URINE PREGNANCY: Preg Test, Ur: NEGATIVE

## 2018-10-30 LAB — POCT HEMOGLOBIN: Hemoglobin: 14.1 g/dL (ref 11–14.6)

## 2018-10-30 MED ORDER — PROPRANOLOL HCL 10 MG PO TABS: 10.0000 mg | ORAL_TABLET | ORAL | 0 refills | Status: DC | PRN

## 2018-10-30 MED FILL — PROPRANOLOL 10 MG TABLET: 10 | 20 days supply | Qty: 20 | Fill #0

## 2018-10-30 NOTE — Progress Notes (Signed)
Subjective:  Patient ID: Dorothy Schwartz  DOB: 1990/06/17 MRN: [id number]  Dorothy Schwartz is a 28 y.o. female with a PMH of HTN, here today for dizziness, anxiety, .   HPI:  Anxiety: - patient reports that often has times where her heart will beat quickly and she will feel as if her chest is tight and the world is closing in on her, and she can't seem to get out of it.  -she is unable to take long trips away from home due to anxiety regarding being away -she has talked to people in the past to help, but she does not think this will help her -patient endorses using xanax daily that she obtains illegally- initial questioning, says she uses occasionally> then states it is everyday -patient however says shes not interested in an everyday medication, although she takes xanax that way -states she was once on clonopin as a child but the switched her to xanax; never tried an SSRI, atarax or other medication bc she is worried about side effects -no SI, HI  Hypertension: - Medications: amlodipine - Compliance: yes - Checking BP at home: reports when she checks at home, it is high, does not know if it is her cuff, but she has pressures >160/100 - Denies any SOB, CP, vision changes, LE edema, medication SEs, or symptoms of hypotension  Dizzy: -reports she thought she was pregnant although she has her tubes tied bc she has been dizzy, but she got her period this am. She reports her dizziness is better, but she has had "low iron" before that made her dizzy too  Tobacco use disorder: Smoking status reviewed- trying to quit, not interested in help at the time  ROS:  as mentioned in HPI  Family hx: no known family hx of bipolar, depression/anxiety  Social hx: Reports suing xanax not prescribed   Patient Active Problem List   Diagnosis Date Noted  . Dizzy 11/03/2018  . Anxiety 11/03/2018  . Tobacco use disorder 11/03/2018  . Ankle fracture, left 07/19/2018  . HTN (hypertension) 03/25/2017      Objective:  BP 128/84   Pulse 72   Wt 165 lb (74.8 kg)   LMP 10/30/2018 (Exact Date)   SpO2 98%   BMI 29.23 kg/m   Vitals and nursing note reviewed  General: NAD, pleasant Pulm: normal effort Extremities: no edema or cyanosis. WWP. Skin: warm and dry, no rashes noted Neuro: alert and oriented, no focal deficits Psych: Neatly groomed and appropriately dressed. Maintains good eye contact and is cooperative and attentive. Speech is normal volume and rate. Denies SI/ HI. Normal affect.  Depression screen [facility] 2/9 10/30/2018 10/30/2018 09/11/2017 08/27/2017 05/29/2017  Decreased Interest 0 0 0 0 0  Down, Depressed, Hopeless 1 0 0 0 0  PHQ - 2 Score 1 0 0 0 0  Altered sleeping 0 - - - -  Tired, decreased energy 1 - - - -  Change in appetite 2 - - - -  Feeling bad or failure about yourself  0 - - - -  Trouble concentrating 0 - - - -  Moving slowly or fidgety/restless 0 - - - -  Suicidal thoughts 0 - - - -  PHQ-9 Score 4 - - - -   GAD 7 : Generalized Anxiety Score 10/30/2018  Nervous, Anxious, on Edge 2  Control/stop worrying 2  Worry too much - different things 2  Trouble relaxing 2  Restless 0  Easily annoyed or irritable 0  Afraid - awful might happen 2  Total GAD 7 Score 10  Anxiety Difficulty Somewhat difficult    Assessment & Plan:   HTN (hypertension) Patient to continue current management, BP 128/84; however, home cuff shows much higher pressures per patient, >160/100's  -schedule an appointment with Dr. Valentina Lucks for 24 BP cuff monitoring  Dizzy POCT hgb wnl. Could be related to patient's use of illicit xanax, patient reports is it also improving, will continue to monitor. Encouraged to remain hydrated and may need orthostatic vitals if sx continue.   Anxiety GAD 7 score is 10. She asks if there is a medication that she can take as needed for anxiety instead of taking a daily medication.  We discussed that the as needed medication is not a great option for chronic control of  her anxiety.  I discussed that Xanax is not a good option and explained he risks of this medication long term; and it is not good especially if she is not considering starting a daily medication for maintenance, and she plans to wean off of this. We discussed propranolol as a trial for situational anxiety given her palpitations- and she will return to see if we should consider atarax.    Tobacco use disorder Not interested in help with quitting at this time   Dorothy Schwartz, [facility] [place] [facility] Resident PGY-2

## 2018-10-30 NOTE — Patient Instructions (Addendum)
Thank you for coming to see me today. It was a pleasure! Today we talked about:   Please schedule an appointment with Dr. Valentina Lucks for a 24 hour blood pressure monitor.  I have started you on propranolol that you may take for situational anxiety, please take this 1 hour before you know you're doing something that causes anxiety.  You should try to stop your xanax, but try to wean off of these by taking half at a time before completely stopping.  Please follow-up in 4-6 weeks or sooner as needed.  If you have any questions or concerns, please do not hesitate to call the office at [phone].  Take Care,   [place] Claramae Rigdon, DO

## 2018-11-03 ENCOUNTER — Encounter: Payer: Self-pay | Admitting: Family Medicine

## 2018-11-03 DIAGNOSIS — R42 Dizziness and giddiness: Secondary | ICD-10-CM | POA: Insufficient documentation

## 2018-11-03 DIAGNOSIS — F17200 Nicotine dependence, unspecified, uncomplicated: Secondary | ICD-10-CM | POA: Insufficient documentation

## 2018-11-03 DIAGNOSIS — F419 Anxiety disorder, unspecified: Secondary | ICD-10-CM | POA: Insufficient documentation

## 2018-11-03 NOTE — Assessment & Plan Note (Signed)
GAD 7 score is 10. She asks if there is a medication that she can take as needed for anxiety instead of taking a daily medication.  We discussed that the as needed medication is not a great option for chronic control of her anxiety.  I discussed that Xanax is not a good option and explained he risks of this medication long term; and it is not good especially if she is not considering starting a daily medication for maintenance, and she plans to wean off of this. We discussed propranolol as a trial for situational anxiety given her palpitations- and she will return to see if we should consider atarax.

## 2018-11-03 NOTE — Assessment & Plan Note (Signed)
POCT hgb wnl. Could be related to patient's use of illicit xanax, patient reports is it also improving, will continue to monitor. Encouraged to remain hydrated and may need orthostatic vitals if sx continue.

## 2018-11-03 NOTE — Assessment & Plan Note (Signed)
Patient to continue current management, BP 128/84; however, home cuff shows much higher pressures per patient, >160/100's  -schedule an appointment with Dr. Valentina Lucks for 24 BP cuff monitoring

## 2018-11-03 NOTE — Assessment & Plan Note (Signed)
Not interested in help with quitting at this time

## 2018-11-05 ENCOUNTER — Ambulatory Visit (INDEPENDENT_AMBULATORY_CARE_PROVIDER_SITE_OTHER): Payer: Medicaid Other | Admitting: Pharmacist

## 2018-11-05 ENCOUNTER — Encounter: Payer: Self-pay | Admitting: Pharmacist

## 2018-11-05 ENCOUNTER — Other Ambulatory Visit: Payer: Self-pay

## 2018-11-05 DIAGNOSIS — I10 Essential (primary) hypertension: Secondary | ICD-10-CM

## 2018-11-05 NOTE — Progress Notes (Signed)
S:    Patient arrives in fair spirits, ambulating without assistance.  Pleasant, energetic.  Presents to the clinic for ambulatory blood pressure evaluation.  Patient was referred on 10/30/2018 by Dr. Martinique Shirley. New patient to our practice.   Diagnosed with Hypertension approximately 5 years abo per patient.   Medication compliance is reported to be NONE.  Reports NOT taking amlodipine due to her making her feel "bad", described as dizzy, light-headed and fatigued.  She also reports NOT filling/taking propranolol which was prescribed to her at visit 6 days ago. Anxiety (panic attacks) reported to occur 2-5 x per week routinely.   Discussed procedure for wearing the monitor and gave patient written instructions. Monitor was placed on non-dominant arm with instructions to return in the morning.   Current BP Medications include:    NOT taking amlodipine 5mg  daily, not yet initiated propranolol (recently prescribed for anxiety).   Antihypertensives tried in the past include: No other  Home blood pressure readings reported as 150/90 "during panic attacks"   Readings expected to be OFF all drug tx for hypertension for this evaluation (as she admitted non-adherence and symptoms consistent with hypotension - fatigue, dizziness)   Patient returned to the clinic and returned meter on Friday 6/12.  Meter was interrogated on Monday 6/15.  Patient was contacted via phone on 6/15 9:00 AM   Importance of quitting smoking: 10/10 Confidence in quitting in short-term (6 months): "minimal/low" O:   Physical Exam Musculoskeletal:        General: No swelling.  Psychiatric:     Comments: Appeared mildly anxious throughout visit.    Review of Systems  Psychiatric/Behavioral: The patient is nervous/anxious.   All other systems reviewed and are negative.   Last 3 Office BP readings: BP Readings from Last 3 Encounters:  11/05/18 (!) 168/98  10/30/18 128/84  09/19/18 (!) 135/97    BMET    Today's Office BP reading: 168 98 mmHg (manual reading)  ABPM Study Data: Arm Placement left arm   Overall Mean 24hr BP:   131/84 mmHg HR: 68   Daytime Mean BP:  144/93 mmHg HR: 70   Nighttime Mean BP:  113/70 mmHg HR: 54   Dipping Pattern: Yes.    Sys:   21.7%   Dia: 24.7%   [normal dipping ~10-20%]   Non-hypertensive ABPM thresholds: daytime BP <125/75 mmHg, sleeptime BP <120/70 mmHg   BMET    Component Value Date/Time   NA 139 07/20/2018 0448   NA 141 03/26/2017 1034   K 3.5 07/20/2018 0448   CL 108 07/20/2018 0448   CO2 24 07/20/2018 0448   GLUCOSE 102 (H) 07/20/2018 0448   BUN 7 07/20/2018 0448   BUN 10 03/26/2017 1034   CREATININE 0.63 07/20/2018 0448   CALCIUM 8.3 (L) 07/20/2018 0448   GFRNONAA >60 07/20/2018 0448   GFRAA >60 07/20/2018 0448    A/P: History of hypertension reported by patient as 5 years found to have variable pressures with profound nocturnal dipping pattern that is normal.   Her lower readings while resting or asleep may be explained by poorly controlled anxiety currently managed with alprazolam (from "alternate" sources - not prescribed).   She was NOT taking any amlodipine and had not started recently prescribed  propranolol during this evaluation.   Patient called 6/15 and results were shared.  We agreed to continue OFF both amlodipine and propranolol until she comes to her next PCP evaluation.  We discussed her health goals including improved  anxiety control and quitting smoking.  She stated she will hold both propranolol and amlodipine until her visit she will schedule in the coming 2-3 weeks.   Total time in face-to-face counseling 15 minutes.  Time for Phone call follow-up was 12 minutes. F/U Clinic Visit with PCP.  Patient seen with Evangeline Gula PharmD Candidate and Catie Darnelle Maffucci, PharmD, PGY2 Pharmacy Resident

## 2018-11-05 NOTE — Patient Instructions (Signed)
Wearing the Blood Pressure Monitor  The cuff will inflate every 20 minutes during the day and every 30 minutes while you sleep.  Your blood pressure readings will NOT display after cuff inflation  Fill out the blood pressure-activity diary during the day, especially during activities that may affect your reading -- such as exercise, stress, walking, taking your blood pressure medications  Important things to know:  Avoid taking the monitor off for the next 24 hours, unless it causes you discomfort or pain.  Do NOT get the monitor wet and do NOT dry to clean the monitor with any cleaning products.  Do NOT drop the monitor.  Do NOT put the monitor on anyone else's arm.  When the cuff inflates, avoid excess movement. Let the cuffed arm hang loosely, slightly away from the body. Avoid flexing the muscles or moving the hand/fingers.  When you go to sleep, make sure that the hose is not kinked.  Remember to fill out the blood pressure activity diary.  If you experience severe pain or unusual pain (not associated with getting your blood pressure checked), remove the monitor.  Troubleshooting:  Code  Troubleshooting   1  Check cuff position, tighten cuff   2, 3  Remain still during reading   4, 87  Check air hose connections and make sure cuff is tight   85, 89  Check hose connections and make tubing is not crimped   86  Push START/STOP to restart reading   88, 91  Retry by pushing START/STOP   90  Replace batteries. If problem persists, remove monitor and bring back to   clinic at follow up   97, 98, 99  Service required - Remove monitor and bring back to clinic at follow up    Blood Pressure Activity Diary  Time Lying down/ Sleeping Walking/ Exercise Stressed/ Angry Headache/ Pain Dizzy  9 AM       10 AM       11 AM       12 PM       1 PM       2 PM       Time Lying down/ Sleeping Walking/ Exercise Stressed/ Angry Headache/ Pain Dizzy  3 PM       4 PM        5 PM       6  PM       7 PM       8 PM       Time Lying down/ Sleeping Walking/ Exercise Stressed/ Angry Headache/ Pain Dizzy  9 PM       10 PM       11 PM       12 AM       1 AM       2 AM       3 AM       Time Lying down/ Sleeping Walking/ Exercise Stressed/ Angry Headache/ Pain Dizzy  4 AM       5 AM       6 AM       7 AM       8 AM       9 AM       10 AM        Time you woke up: _________                  Time you went to sleep:__________  Come back tomorrow between 8:30 and 9:30 AM to have the monitor removed  Call the [facility] if you have any questions before then [phone])

## 2018-11-09 NOTE — Assessment & Plan Note (Signed)
History of hypertension reported by patient as 5 years found to have variable pressures with profound nocturnal dipping pattern that is normal.   Her lower readings while resting or asleep may be explained by poorly controlled anxiety currently managed with alprazolam (from "alternate" sources - not prescribed).   She was NOT taking any amlodipine and had not started recently prescribed  propranolol during this evaluation.   Patient called 6/15 and results were shared.  We agreed to continue OFF both amlodipine and propranolol until she comes to her next PCP evaluation.  We discussed her health goals including improved anxiety control and quitting smoking.  She stated she will hold both propranolol and amlodipine until her visit she will schedule in the coming 2-3 weeks.

## 2018-11-09 NOTE — Progress Notes (Signed)
Patient ID: Dorothy Schwartz, female   DOB: 1990-12-24, 28 y.o.   MRN: [id number] Reviewed: Agree with Dr. Graylin Shiver documentation and management.

## 2018-11-10 ENCOUNTER — Other Ambulatory Visit: Payer: Self-pay

## 2018-11-10 ENCOUNTER — Encounter (HOSPITAL_COMMUNITY): Payer: Self-pay

## 2018-11-10 ENCOUNTER — Ambulatory Visit (HOSPITAL_COMMUNITY)
Admission: EM | Admit: 2018-11-10 | Discharge: 2018-11-10 | Disposition: A | Discharge status: Home / Self Care | Payer: Medicaid Other | Attending: Emergency Medicine | Admitting: Emergency Medicine

## 2018-11-10 DIAGNOSIS — B9689 Other specified bacterial agents as the cause of diseases classified elsewhere: Secondary | ICD-10-CM

## 2018-11-10 DIAGNOSIS — N760 Acute vaginitis: Secondary | ICD-10-CM | POA: Diagnosis not present

## 2018-11-10 MED ORDER — FLUCONAZOLE 200 MG PO TABS: 200.0000 mg | ORAL_TABLET | Freq: Once | ORAL | 0 refills | Status: AC

## 2018-11-10 MED ORDER — METRONIDAZOLE 500 MG PO TABS: 500.0000 mg | ORAL_TABLET | Freq: Two times a day (BID) | ORAL | 0 refills | Status: DC

## 2018-11-10 NOTE — ED Provider Notes (Signed)
[facility]    CSN: [id number] Arrival date & time: 11/10/18  1455     History   Chief Complaint Chief Complaint  Patient presents with  . Vaginitis    HPI SHAQUEL JOSEPHSON is a 28 y.o. female with history of BV, hypertension presenting for acute concern of BV.  Patient states she is had symptoms for 4 days.  Patient endorsing vaginal discharge, malodor.  Patient states that she notices this worse during and after intercourse.  Patient having unprotected sex with one female partner.  Denies abdominal/pelvic/vaginal pain, abnormal bleeding, anogenital lesions, pruritus.  Patient states she was tested for STDs on 4/25: Negative, and pregnancy on 6/5: Negative.  Patient declines retesting today.  Patient last treated with Flagyl in April.  States that she abstains from products with fragrance, douching.  Past Medical History:  Diagnosis Date  . Anemia 2011   postop anemia after CS  . Headache(784.0)   . Hypertension   . Kidney stones   . Nephrolithiasis 03/25/2017  . Palpitations    per pt due to stress    Patient Active Problem List   Diagnosis Date Noted  . Dizzy 11/03/2018  . Anxiety 11/03/2018  . Tobacco use disorder 11/03/2018  . Ankle fracture, left 07/19/2018  . HTN (hypertension) 03/25/2017    Past Surgical History:  Procedure Laterality Date  . CESAREAN SECTION    . CESAREAN SECTION WITH BILATERAL TUBAL LIGATION Bilateral 08/13/2012   Procedure: CESAREAN SECTION WITH BILATERAL TUBAL LIGATION;  Surgeon: Margarette Asal, MD;  Location: [facility];  Service: Obstetrics;  Laterality: Bilateral;  Repeat edc 08/09/12  . ORIF ANKLE FRACTURE Left 07/19/2018   Procedure: OPEN REDUCTION INTERNAL FIXATION (ORIF) ANKLE FRACTURE;  Surgeon: Corky Mull, MD;  Location: [facility];  Service: Orthopedics;  Laterality: Left;  . WISDOM TOOTH EXTRACTION      OB History    Gravida  2   Para  2   Term  2   Preterm      AB      Living  2     SAB      TAB      Ectopic      Multiple      Live Births  1            Home Medications    Prior to Admission medications   Medication Sig Start Date End Date Taking? Authorizing Provider  ALPRAZolam Duanne Moron) 1 MG tablet Take 1 mg by mouth daily.    [provider]  amLODipine (NORVASC) 5 MG tablet Take 1 tablet (5 mg total) by mouth daily. Patient not taking: Reported on 11/05/2018 10/22/18   Steve Rattler, DO  fluconazole (DIFLUCAN) 200 MG tablet Take 1 tablet (200 mg total) by mouth once for 1 dose. May repeat in 72 hours if needed 11/10/18 11/10/18  Hall-Potvin, [place], PA-C  metroNIDAZOLE (FLAGYL) 500 MG tablet Take 1 tablet (500 mg total) by mouth 2 (two) times daily. 11/10/18   Hall-Potvin, [place], PA-C  propranolol (INDERAL) 10 MG tablet Take 1 tablet (10 mg total) by mouth as needed (anxiety, take 1 hour before event that causes anxiety). Patient not taking: Reported on 11/05/2018 10/30/18   Shirley, [place], DO    Family History Family History  Problem Relation Age of Onset  . Hypertension Mother   . Mood Disorder Mother   . Lung cancer Mother        tobacco use  . Colon cancer Mother  35  . Colon cancer Father 34  . Lung cancer Maternal Grandmother   . Other Neg Hx     Social History Social History   Tobacco Use  . Smoking status: Current Every Day Smoker    Packs/day: 1.00    Types: Cigarettes    Start date: 2004  . Smokeless tobacco: Never Used  . Tobacco comment: Quit with both pregnancy  Substance Use Topics  . Alcohol use: No  . Drug use: Yes    Types: Benzodiazepines     Allergies   Morphine and related and Percocet [oxycodone-acetaminophen]   Review of Systems As per HPI   Physical Exam Triage Vital Signs ED Triage Vitals  Enc Vitals Group     BP 11/10/18 1530 (!) 144/88     Pulse Rate 11/10/18 1530 74     Resp 11/10/18 1530 16     Temp 11/10/18 1530 98.7 F (37.1 C)     Temp Source 11/10/18 1530 Oral     SpO2 11/10/18 1530 99 %      Weight 11/10/18 1531 165 lb (74.8 kg)     Height --      Head Circumference --      Peak Flow --      Pain Score 11/10/18 1531 1     Pain Loc --      Pain Edu? --      Excl. in [place]? --    No data found.  Updated Vital Signs BP (!) 144/88 (BP Location: Right Arm)   Pulse 74   Temp 98.7 F (37.1 C) (Oral)   Resp 16   Wt 165 lb (74.8 kg)   LMP 10/30/2018 (Exact Date)   SpO2 99%   BMI 29.23 kg/m   Visual Acuity Right Eye Distance:   Left Eye Distance:   Bilateral Distance:    Right Eye Near:   Left Eye Near:    Bilateral Near:     Physical Exam Constitutional:      General: She is not in acute distress. HENT:     Head: Normocephalic and atraumatic.  Eyes:     General: No scleral icterus.    Pupils: Pupils are equal, round, and reactive to light.  Cardiovascular:     Rate and Rhythm: Normal rate.  Pulmonary:     Effort: Pulmonary effort is normal.  Skin:    Coloration: Skin is not jaundiced or pale.  Neurological:     Mental Status: She is alert and oriented to person, place, and time.      UC Treatments / Results  Labs (all labs ordered are listed, but only abnormal results are displayed) Labs Reviewed - No data to display  EKG None  Radiology No results found.  Procedures Procedures (including critical care time)  Medications Ordered in UC Medications - No data to display  Initial Impression / Assessment and Plan / UC Course  I have reviewed the triage vital signs and the nursing notes.  Pertinent labs & imaging results that were available during my care of the patient were reviewed by me and considered in my medical decision making (see chart for details).     28 year old female with remote history of BV, presenting for symptoms similar to these episodes.  Will treat with Flagyl, patient to try using condoms during intercourse as that seems to exacerbate her symptoms.  Return precautions discussed, patient verbalized understanding. Final  Clinical Impressions(s) / UC Diagnoses   Final diagnoses:  BV (bacterial  vaginosis)     Discharge Instructions     Take Flagyl as directed.   Try gently using condoms during intercourse to see if this improves her symptoms. Return if you develop fever, abdominal/pelvic/vaginal pain, bleeding.    ED Prescriptions    Medication Sig Dispense Auth. Provider   metroNIDAZOLE (FLAGYL) 500 MG tablet Take 1 tablet (500 mg total) by mouth 2 (two) times daily. 14 tablet Hall-Potvin, [place], PA-C   fluconazole (DIFLUCAN) 200 MG tablet Take 1 tablet (200 mg total) by mouth once for 1 dose. May repeat in 72 hours if needed 2 tablet Hall-Potvin, [place], PA-C     Controlled Substance Prescriptions [place] Controlled Substance Registry consulted? Not Applicable   Quincy Sheehan, [place] 11/10/18 1827

## 2018-11-10 NOTE — Discharge Instructions (Addendum)
Take Flagyl as directed.   Try gently using condoms during intercourse to see if this improves her symptoms. Return if you develop fever, abdominal/pelvic/vaginal pain, bleeding.

## 2018-11-10 NOTE — ED Triage Notes (Signed)
Pt states she has bacteria vaginitis x 4 days.

## 2019-03-12 ENCOUNTER — Other Ambulatory Visit: Payer: Self-pay

## 2019-03-12 ENCOUNTER — Other Ambulatory Visit (HOSPITAL_COMMUNITY)
Admission: RE | Admit: 2019-03-12 | Discharge: 2019-03-12 | Disposition: A | Discharge status: Home / Self Care | Payer: Medicaid Other | Source: Ambulatory Visit | Attending: Family Medicine | Admitting: Family Medicine

## 2019-03-12 ENCOUNTER — Encounter: Payer: Self-pay | Admitting: Family Medicine

## 2019-03-12 ENCOUNTER — Ambulatory Visit (INDEPENDENT_AMBULATORY_CARE_PROVIDER_SITE_OTHER): Payer: Medicaid Other | Admitting: Family Medicine

## 2019-03-12 ENCOUNTER — Telehealth: Payer: Self-pay | Admitting: Family Medicine

## 2019-03-12 VITALS — BP 152/100 | HR 69 | Wt 174.0 lb

## 2019-03-12 DIAGNOSIS — N930 Postcoital and contact bleeding: Secondary | ICD-10-CM | POA: Insufficient documentation

## 2019-03-12 DIAGNOSIS — N939 Abnormal uterine and vaginal bleeding, unspecified: Secondary | ICD-10-CM

## 2019-03-12 DIAGNOSIS — I10 Essential (primary) hypertension: Secondary | ICD-10-CM | POA: Diagnosis not present

## 2019-03-12 DIAGNOSIS — B9689 Other specified bacterial agents as the cause of diseases classified elsewhere: Secondary | ICD-10-CM | POA: Insufficient documentation

## 2019-03-12 DIAGNOSIS — N760 Acute vaginitis: Secondary | ICD-10-CM | POA: Diagnosis not present

## 2019-03-12 LAB — POCT WET PREP (WET MOUNT)
Clue Cells Wet Prep Whiff POC: POSITIVE
Trichomonas Wet Prep HPF POC: ABSENT
WBC, Wet Prep HPF POC: >20

## 2019-03-12 LAB — POCT URINE PREGNANCY: Preg Test, Ur: NEGATIVE

## 2019-03-12 MED ORDER — METRONIDAZOLE 500 MG PO TABS: 500.0000 mg | ORAL_TABLET | Freq: Two times a day (BID) | ORAL | 0 refills | Status: DC

## 2019-03-12 MED FILL — METRONIDAZOLE 500 MG TABS: 500 | 7 days supply | Qty: 14 | Fill #0

## 2019-03-12 NOTE — Patient Instructions (Addendum)
It was wonderful to see you today.  I will let you know the results of your tests within the next week to assess for any infection or Pap smear changes that could be causing this.  Hopefully her bleeding will stop in the next few days, please follow-up if it continues to persist, becomes more severe, or if you develop any fever/abdominal pain.  Please try to follow-up with your primary provider in the next several weeks at your convenience to keep an eye on your blood pressure and anxiety.

## 2019-03-12 NOTE — Assessment & Plan Note (Signed)
Elevated in clinic today, 152/100, in the setting of increased anxiety for presenting complaint.  Recommended close follow-up with her primary care provider for continued monitoring of blood pressure and discuss anxiety control options.  Patient is currently taking Xanax only for long-term anxiety control, is willing to discuss further options.  -Monitor BP at home, bring in journal -Can restart taking amlodipine 5 mg  -Follow-up with primary care provider for hypertension and anxiety within the next few weeks

## 2019-03-12 NOTE — Telephone Encounter (Signed)
**  After Hours/ Emergency Line Call**  Received a call to report that Dorothy Schwartz had missed a call from the clinic just a few minutes prior.  On looking at recent clinic note, patient was tested for STIs as well as wet prep which was positive for BV.  Informed patient of provider plans to send antibiotic to pharmacy.  I also confirmed this with provider patient saw today.  I sent metronidazole x7 days to patient's pharmacy.  Patient stated she would likely start medication on Sunday or Monday given her plans to drink alcohol this weekend.    Rory Percy, DO PGY-3, [facility] 03/12/2019 5:29 PM

## 2019-03-12 NOTE — Assessment & Plan Note (Addendum)
Wet prep showing excessive bacteria, clue cells, and positive whiff consistent with BV.  May be contributing to postcoital bleeding as above, however will continue to follow-up additional studies. -Will send in metronidazole 500 mg twice daily for 7 days -Encouraged wearing cotton underwear, loose fitting clothing, staying well-hydrated, avoiding douching/harsh soaps

## 2019-03-12 NOTE — Assessment & Plan Note (Addendum)
Mild, 3-day history.  Pelvic exam largely unremarkable, confirmed vaginal/uterine source for bleeding.  Differential broad, infectious cervicitis/vaginitis, malignancy, cervical polyp/cyst.  While her wet prep was positive for bacterial vaginosis, would not have expected this to be sole contributor for her bleeding, however will assess with treatment as below. Will continue to follow-up GC/chlamydia.  Additionally, given history of ACS-H with normal colposcopy in 2019, will repeat Pap smear today.  Reassuringly urine pregnancy test negative in the office w/ history of tubal ligation.  Consider cervical polyp/cyst, however no evidence of this on exam. -GC/chlamydia, wet prep -Pap smear with HPV cotesting -Follow-up if bleeding persists, worsens, develops fever/abdominal pain

## 2019-03-12 NOTE — Progress Notes (Addendum)
Subjective:    Patient ID: Dorothy Schwartz, female    DOB: 1991-03-16, 28 y.o.   MRN: [id number]   CC: Postcoital bleeding  HPI: Dorothy Schwartz is a 28 year old female with hypertension not currently on controller therapy, anxiety, ASC-H on papsmear, and recent BV in 10/2018 presenting discuss the following:  Postcoital bleeding: Started 3 days ago, still bleeding. Doesn't have to wear a pad/tampon, but notices when she wipes.  However, did stain her underwear today.  LMP finished 2.5 weeks ago, menstrual cycle regular on a monthly basis, 5 to 7 days on average.  Previous tubal ligation, not on current birth control. Sexually active, one partner 8 months.  Does not use barrier method/condoms.  No concern for STDs, however like to be checked today.  Does have a history of ASC-H with normal colposcopy in 08/2017 and recommended a repeat Pap in 1 year after that.  Denies any change in her vaginal discharge, itching/irritation, abdominal pain, fever, dysuria, change in bowel movements, melena, hematochezia, Dyspareunia.  Does endorse some intermittent vaginal odor.  Has not had this problem before and would like to get it evaluated.  Additionally, in discussion of her elevated blood pressure and history of anxiety, patient states she takes Xanax ("alternative source") on a daily basis.  States this is effective for her anxiety.  She has previously met with Dr. Curt Bears and had ambulatory blood pressure monitoring, felt that maybe her anxiety was contributing to elevated BP.  Has been having more frequent headaches recently and thinks her blood pressure has been more consistently high.  Does have her old amlodipine at home.   Smoking status reviewed  Review of Systems Per HPI    Objective:  BP (!) 152/100   Pulse 69   Wt 174 lb (78.9 kg)   LMP 02/25/2019 (Approximate)   SpO2 99%   BMI 30.82 kg/m  Vitals and nursing note reviewed  General: NAD, pleasant Cardiac: RRR, normal heart sounds, no murmurs  Respiratory: CTAB, normal effort Abdomen: soft, nontender, nondistended Extremities: no edema or cyanosis. WWP. Pelvic: Pelvic exam: normal external genitalia, vulva, vagina, cervix, uterus and adnexa, VULVA: normal appearing vulva with no masses, tenderness or lesions, VAGINA: normal appearing vagina with normal color and discharge, no lesions, bleeding from cervical os noted, CERVIX: normal appearing cervix without discharge or lesions, cervical motion tenderness absent, no polyps or cysts, UTERUS: uterus is normal size, shape, consistency and nontender, ADNEXA: normal adnexa in size, nontender and no masses, WET MOUNT done - results: clue cells, excessive bacteria, white blood cells.  Chaperoned by CMA. Skin: warm and dry, no rashes noted Psych: normal affect  Assessment & Plan:   PCB (post coital bleeding) Mild, 3-day history.  Pelvic exam largely unremarkable, confirmed vaginal/uterine source for bleeding.  Differential broad, infectious cervicitis/vaginitis, malignancy, cervical polyp/cyst.  While her wet prep was positive for bacterial vaginosis, would not have expected this to be sole contributor for her bleeding, however will assess with treatment as below. Will continue to follow-up GC/chlamydia.  Additionally, given history of ACS-H with normal colposcopy in 2019, will repeat Pap smear today.  Reassuringly urine pregnancy test negative in the office w/ history of tubal ligation.  Consider cervical polyp/cyst, however no evidence of this on exam. -GC/chlamydia, wet prep -Pap smear with HPV cotesting -Follow-up if bleeding persists, worsens, develops fever/abdominal pain  Bacterial vaginitis Wet prep showing excessive bacteria, clue cells, and positive whiff consistent with BV.  May be contributing to postcoital bleeding as above, however  will continue to follow-up additional studies. -Will send in metronidazole 500 mg twice daily for 7 days -Encouraged wearing cotton underwear, loose  fitting clothing, staying well-hydrated, avoiding douching/harsh soaps  HTN (hypertension) Elevated in clinic today, 152/100, in the setting of increased anxiety for presenting complaint.  Recommended close follow-up with her primary care provider for continued monitoring of blood pressure and discuss anxiety control options.  Patient is currently taking Xanax only for long-term anxiety control, is willing to discuss further options.  -Monitor BP at home, bring in journal -Can restart taking amlodipine 5 mg  -Follow-up with primary care provider for hypertension and anxiety within the next few weeks   Follow-up if primary complaint not improving or worsening, additionally follow-up in a few weeks for hypertension/anxiety  [place] Resident PGY-2

## 2019-03-13 LAB — RPR: RPR Ser Ql: NONREACTIVE

## 2019-03-13 LAB — HIV ANTIBODY (ROUTINE TESTING W REFLEX): HIV Screen 4th Generation wRfx: NONREACTIVE

## 2019-03-23 LAB — CYTOLOGY - PAP
Chlamydia: NEGATIVE
Comment: NEGATIVE
Comment: NEGATIVE
Comment: NORMAL
Diagnosis: NEGATIVE
High risk HPV: POSITIVE — AB
Neisseria Gonorrhea: NEGATIVE

## 2019-03-23 NOTE — Telephone Encounter (Signed)
Yes thank you so much Janett Billow!  I had already touch base with Herbie [place] last week due to the prolonged wait time for her Pap smear and Gc/Ch, so hopefully should be resulting shortly.  Will let her know as soon as these results come back.  Patriciaann Clan, DO

## 2019-03-23 NOTE — Telephone Encounter (Signed)
Pt is calling for results of STD testing and PAP.   Advised of negative STD screen (GC/CH check by Herbie [place] as they were not in the system yet.)  Pt would also like a call with Pap results when they are available.  Christen Bame, CMA

## 2019-03-25 ENCOUNTER — Telehealth: Payer: Self-pay | Admitting: Family Medicine

## 2019-03-25 NOTE — Telephone Encounter (Signed)
Reached pt and discussed recent results.   Through current ASCCP guidelines, should have a repeat colposcopy (history of ASC-H in 2019 with HPV not detected with clinically normal colposcopy (no biopsy performed) and now with normal cytology but high risk HPV positive). Scheduled for colposcopy on 11/5 at 9:30am.   Patient also endorses strong family history of cancers including colon and breast (no uterine or cervical), so states she is really wanting to verify there is nothing abnormal. Provided supportive listening.   Patriciaann Clan, DO

## 2019-04-01 ENCOUNTER — Ambulatory Visit: Payer: Medicaid Other | Admitting: Family Medicine

## 2019-04-01 ENCOUNTER — Other Ambulatory Visit: Payer: Self-pay

## 2019-04-01 ENCOUNTER — Ambulatory Visit (INDEPENDENT_AMBULATORY_CARE_PROVIDER_SITE_OTHER): Payer: Medicaid Other | Admitting: Family Medicine

## 2019-04-01 VITALS — BP 125/80 | HR 75 | Temp 98.8°F | Wt 165.0 lb

## 2019-04-01 DIAGNOSIS — R87619 Unspecified abnormal cytological findings in specimens from cervix uteri: Secondary | ICD-10-CM | POA: Insufficient documentation

## 2019-04-01 DIAGNOSIS — B9689 Other specified bacterial agents as the cause of diseases classified elsewhere: Secondary | ICD-10-CM | POA: Diagnosis not present

## 2019-04-01 DIAGNOSIS — N898 Other specified noninflammatory disorders of vagina: Secondary | ICD-10-CM | POA: Diagnosis not present

## 2019-04-01 DIAGNOSIS — N760 Acute vaginitis: Secondary | ICD-10-CM

## 2019-04-01 LAB — POCT WET PREP (WET MOUNT)
Clue Cells Wet Prep Whiff POC: POSITIVE
Trichomonas Wet Prep HPF POC: ABSENT

## 2019-04-01 LAB — POCT URINE PREGNANCY: Preg Test, Ur: NEGATIVE

## 2019-04-01 MED ORDER — METRONIDAZOLE 500 MG PO TABS: 500.0000 mg | ORAL_TABLET | Freq: Two times a day (BID) | ORAL | 0 refills | Status: AC

## 2019-04-01 MED FILL — METRONIDAZOLE 500 MG TABS: 500 | 7 days supply | Qty: 14 | Fill #0

## 2019-04-01 NOTE — Patient Instructions (Addendum)
It was nice seeing you today. Your colposcopy looks normal. No biopsy needed today. Please return to your PCP in 1 year for repeat PAP.   Colposcopy, Care After This sheet gives you information about how to care for yourself after your procedure. Your doctor may also give you more specific instructions. If you have problems or questions, contact your doctor. What can I expect after the procedure? If you did not have a tissue sample removed (did not have a biopsy), you may only have some spotting for a few days. You can go back to your normal activities. If you had a tissue sample removed, it is common to have:  Soreness and pain. This may last for a few days.  Light-headedness.  Mild bleeding from your vagina or dark-colored, grainy discharge from your vagina. This may last for a few days. You may need to wear a sanitary pad.  Spotting for at least 48 hours after the procedure. Follow these instructions at home:   Take over-the-counter and prescription medicines only as told by your doctor. Ask your doctor what medicines you can start taking again. This is very important if you take blood-thinning medicine.  Do not drive or use heavy machinery while taking prescription pain medicine.  For 3 days, or as long as your doctor tells you, avoid: ? Douching. ? Using tampons. ? Having sex.  If you use birth control (contraception), keep using it.  Limit activity for the first day after the procedure. Ask your doctor what activities are safe for you.  It is up to you to get the results of your procedure. Ask your doctor when your results will be ready.  Keep all follow-up visits as told by your doctor. This is important. Contact a doctor if:  You get a skin rash. Get help right away if:  You are bleeding a lot from your vagina. It is a lot of bleeding if you are using more than one pad an hour for 2 hours in a row.  You have clumps of blood (blood clots) coming from your vagina.   You have a fever.  You have chills  You have pain in your lower belly (pelvic area).  You have signs of infection, such as vaginal discharge that is: ? Different than usual. ? Yellow. ? Bad-smelling.  You have very pain or cramps in your lower belly that do not get better with medicine.  You feel light-headed.  You feel dizzy.  You pass out (faint). Summary  If you did not have a tissue sample removed (did not have a biopsy), you may only have some spotting for a few days. You can go back to your normal activities.  If you had a tissue sample removed, it is common to have mild pain and spotting for 48 hours.  For 3 days, or as long as your doctor tells you, avoid douching, using tampons and having sex.  Get help right away if you have bleeding, very bad pain, or signs of infection. This information is not intended to replace advice given to you by your health care provider. Make sure you discuss any questions you have with your health care provider. Document Released: 10/30/2007 Document Revised: 04/25/2017 Document Reviewed: 01/31/2016 Elsevier Patient Education  2020 [employer].

## 2019-04-01 NOTE — Progress Notes (Signed)
Patient ID: Dorothy Schwartz, female   DOB: [birth year], 28 y.o.   MRN: [id number]  Chief Complaint  Patient presents with  . Colposcopy    HPI Dorothy Schwartz is a 28 y.o. female.   Presents for colposcopy for negative Pap, positive high risk HPV.  Also notes that she had bacterial vaginosis a few weeks ago and continued to have intercourse during treatment.  States that her symptoms improved with flagyl, but then came back a few days ago.  Endorses increased discharge and increased odor.  No burning with urination, pelvic pain, vaginal bleeding, fevers.   Indications: Pap smear on March 12, 2019 showed: no abnormalities with positive High Risk HPV. Previous colposcopy: normal exam without visible pathology and in 2019. Prior cervical treatment: no treatment.  Past Medical History:  Diagnosis Date  . Anemia 2011   postop anemia after CS  . Headache(784.0)   . Hypertension   . Kidney stones   . Nephrolithiasis 03/25/2017  . Palpitations    per pt due to stress    Past Surgical History:  Procedure Laterality Date  . CESAREAN SECTION    . CESAREAN SECTION WITH BILATERAL TUBAL LIGATION Bilateral 08/13/2012   Procedure: CESAREAN SECTION WITH BILATERAL TUBAL LIGATION;  Surgeon: Margarette Asal, MD;  Location: [facility];  Service: Obstetrics;  Laterality: Bilateral;  Repeat edc 08/09/12  . ORIF ANKLE FRACTURE Left 07/19/2018   Procedure: OPEN REDUCTION INTERNAL FIXATION (ORIF) ANKLE FRACTURE;  Surgeon: Corky Mull, MD;  Location: [facility];  Service: Orthopedics;  Laterality: Left;  . WISDOM TOOTH EXTRACTION      Family History  Problem Relation Age of Onset  . Hypertension Mother   . Mood Disorder Mother   . Lung cancer Mother        tobacco use  . Colon cancer Mother 28  . Colon cancer Father 76  . Lung cancer Maternal Grandmother   . Other Neg Hx     Social History Social History   Tobacco Use  . Smoking status: Current Every Day Smoker    Packs/day: 1.00    Types:  Cigarettes    Start date: 2004  . Smokeless tobacco: Never Used  . Tobacco comment: Quit with both pregnancy  Substance Use Topics  . Alcohol use: No  . Drug use: Yes    Types: Benzodiazepines    Allergies  Allergen Reactions  . Morphine And Related Shortness Of Breath    Pt. States she can tolerate Dilaudid & has had it before.  Marland Kitchen Percocet [Oxycodone-Acetaminophen] Hives and Rash    Denies shortness of breath    Current Outpatient Medications  Medication Sig Dispense Refill  . ALPRAZolam (XANAX) 1 MG tablet Take 1 mg by mouth daily.    Marland Kitchen amLODipine (NORVASC) 5 MG tablet Take 1 tablet (5 mg total) by mouth daily. (Patient not taking: Reported on 11/05/2018) 90 tablet 0  . metroNIDAZOLE (FLAGYL) 500 MG tablet Take 1 tablet (500 mg total) by mouth 2 (two) times daily for 7 days. 14 tablet 0  . propranolol (INDERAL) 10 MG tablet Take 1 tablet (10 mg total) by mouth as needed (anxiety, take 1 hour before event that causes anxiety). (Patient not taking: Reported on 11/05/2018) 20 tablet 0   No current facility-administered medications for this visit.     Review of Systems Review of Systems  Constitutional: Negative for fever.  Genitourinary: Positive for vaginal discharge. Negative for vaginal pain.    Blood pressure 125/80, pulse 75,  temperature 98.8 F (37.1 C), temperature source Oral, weight 165 lb (74.8 kg), last menstrual period 03/06/2019, SpO2 98 %.  Physical Exam Physical Exam Constitutional:      Appearance: Normal appearance.  Pulmonary:     Effort: Pulmonary effort is normal.  Neurological:     Mental Status: She is alert.     Data Reviewed Pap reviewied  Assessment    Procedure Details  The risks and benefits of the procedure and Written informed consent obtained.  Speculum placed in vagina and excellent visualization of cervix achieved, cervix swabbed x 3 with acetic acid solution and Lugol's solution.  No acetowhite lesions, punctation, mosaicism,  abnormal vasculature.  No biopsy performed.  Specimens: none  Complications: none.     Plan Abnormal cervical Papanicolaou smear Colpo performed today, no biopsy indicated.  Repeat pap with co-testing in 1 year.  Bacterial vaginosis Wet prep consistent with BV.  Had good improvement with flagyl and would like to have this again.  Advised to abstain from sex while being treated.  Advised to use women's probiotic as well to decrease future episodes of bacterial vaginosis.  Advised against douching and scented soaps.   Specimens labelled and sent to Pathology. Return for repeat pap and co-testing in 1 year.      Bernita Raisin Meccariello 04/01/2019, 11:06 AM

## 2019-04-01 NOTE — Assessment & Plan Note (Signed)
Colpo performed today, no biopsy indicated.  Repeat pap with co-testing in 1 year.

## 2019-04-01 NOTE — Assessment & Plan Note (Signed)
Wet prep consistent with BV.  Had good improvement with flagyl and would like to have this again.  Advised to abstain from sex while being treated.  Advised to use women's probiotic as well to decrease future episodes of bacterial vaginosis.  Advised against douching and scented soaps.

## 2019-05-12 ENCOUNTER — Other Ambulatory Visit: Payer: Self-pay

## 2019-05-12 ENCOUNTER — Ambulatory Visit (INDEPENDENT_AMBULATORY_CARE_PROVIDER_SITE_OTHER): Payer: Medicaid Other | Admitting: Family Medicine

## 2019-05-12 VITALS — BP 148/88 | HR 79 | Wt 174.6 lb

## 2019-05-12 DIAGNOSIS — I10 Essential (primary) hypertension: Secondary | ICD-10-CM

## 2019-05-12 DIAGNOSIS — F419 Anxiety disorder, unspecified: Secondary | ICD-10-CM | POA: Diagnosis not present

## 2019-05-12 MED ORDER — AMLODIPINE BESYLATE 5 MG PO TABS: 5.0000 mg | ORAL_TABLET | Freq: Every day | ORAL | 0 refills | Status: DC

## 2019-05-12 NOTE — Progress Notes (Signed)
    Subjective:  Dorothy Schwartz is a 28 y.o. female who presents to the [facility] today with a chief complaint of discussion of hypertension meds.   HPI: HTN (hypertension) Patient with long-term inconsistent use of amlodipine.  Has not been using for months.  Says that she would like to get her blood pressure under control.  We discussed that an ACE inhibitor might be the first line indicated but she says that she has heard about side effects of that and she is uncomfortable with starting that.  She is willing to reconsider starting amlodipine   Anxiety Patient has not been taking the propranolol that was prescribed to her.  She has been taking inconsistent amounts of Xanax that she says is not prescribed but she has access to.  She does not intend to stop but she wants [place] to be aware in case there are any interactions with other medication plans.  She says that she is safe and has no SI HI and that her anxiety is only mildly interfering with her schedule.    Objective:  Physical Exam: BP (!) 148/88   Pulse 79   Wt 174 lb 9.6 oz (79.2 kg)   LMP 04/01/2019 (Approximate)   SpO2 98%   BMI 30.93 kg/m   Gen: NAD, speaking calmly CV: RRR with no murmurs appreciated Pulm: NWOB, CTAB with no crackles, wheezes, or rhonchi MSK: no edema, cyanosis, or clubbing noted Skin: warm, dry Neuro: grossly normal, moves all extremities Psych: Normal affect and thought content, good insight into conditions  No results found for this or any previous visit (from the past 72 hour(s)).   Assessment/Plan:  Anxiety Patient has not been taking the propranolol that was prescribed to her.  She has been taking inconsistent amounts of Xanax that she says is not prescribed but she has access to.  She does not intend to stop but she wants [place] to be aware in case there are any interactions with other medication plans.  She says that she is safe and has no SI HI and that her anxiety is only mildly interfering with her  schedule.  She was open to discussion of an SSRI as a first-line treatment for generalized anxiety but wants to do some personal research on these items first before she will consider starting.  Emergency precautions given, advised to wean off nonprescribed Xanax, advised to follow-up with primary care physician for further discussion of starting SSRI after she is had time to do her personal research.  HTN (hypertension) Patient with long-term inconsistent use of amlodipine.  Has not been using for months.  Says that she would like to get her blood pressure under control.  We discussed that an ACE inhibitor might be the first line indicated but she says that she has heard about side effects of that and she is uncomfortable with starting that.  She is willing to reconsider starting amlodipine which I will reorder for her.  Advised to come back in approximately a month for recheck and discussion with her primary care physician about potential further need for blood pressure control   Sherene Sires, [place] - PGY3 05/15/2019 9:31 AM

## 2019-05-12 NOTE — Patient Instructions (Signed)
It was a pleasure to see you today! Thank you for choosing [facility] for your primary care. Dorothy Schwartz was seen for anxiety and hypertension. Come back to the clinic wants to research SSRIs so that you can discuss starting one with Dr. Pilar Plate.  Today we talked about your hypertension and anxiety.  As you are comfortable with the amlodipine we are going to restart that for you at 5 mg daily.  We discussed that you are not ready to start therapy at this time for anxiety, I do want you to continue to consider that.  We also discussed that you are not ready to commit to a particular medication for your anxiety, I know that your alprazolam is not prescribed and my suggestion would be that you consider something out of the class called SSRI.  He said that he wanted to review these medicines and get some more information about them prior to making decision, feel free to do that and once you are ready schedule appointment with Dr. Pilar Plate that you guys can discuss it   Please bring all your medications to every doctors visit   Sign up for My Chart to have easy access to your labs results, and communication with your Primary care physician.     Please check-out at the front desk before leaving the clinic.     Best,  Dr. Sherene Sires FAMILY MEDICINE RESIDENT - PGY3 05/12/2019 4:12 PM

## 2019-05-15 NOTE — Assessment & Plan Note (Signed)
Patient with long-term inconsistent use of amlodipine.  Has not been using for months.  Says that she would like to get her blood pressure under control.  We discussed that an ACE inhibitor might be the first line indicated but she says that she has heard about side effects of that and she is uncomfortable with starting that.  She is willing to reconsider starting amlodipine which I will reorder for her.  Advised to come back in approximately a month for recheck and discussion with her primary care physician about potential further need for blood pressure control

## 2019-05-15 NOTE — Assessment & Plan Note (Signed)
Patient has not been taking the propranolol that was prescribed to her.  She has been taking inconsistent amounts of Xanax that she says is not prescribed but she has access to.  She does not intend to stop but she wants [place] to be aware in case there are any interactions with other medication plans.  She says that she is safe and has no SI HI and that her anxiety is only mildly interfering with her schedule.  She was open to discussion of an SSRI as a first-line treatment for generalized anxiety but wants to do some personal research on these items first before she will consider starting.  Emergency precautions given, advised to wean off nonprescribed Xanax, advised to follow-up with primary care physician for further discussion of starting SSRI after she is had time to do her personal research.

## 2019-05-18 MED FILL — AMLODIPINE BESYLATE 5 MG TA: 5 | 90 days supply | Qty: 90 | Fill #0

## 2019-05-19 ENCOUNTER — Other Ambulatory Visit: Payer: Self-pay

## 2019-05-19 ENCOUNTER — Telehealth (INDEPENDENT_AMBULATORY_CARE_PROVIDER_SITE_OTHER): Payer: Medicaid Other | Admitting: Family Medicine

## 2019-05-19 DIAGNOSIS — F419 Anxiety disorder, unspecified: Secondary | ICD-10-CM

## 2019-05-19 MED ORDER — HYDROXYZINE HCL 25 MG PO TABS: 25.0000 mg | ORAL_TABLET | Freq: Three times a day (TID) | ORAL | 1 refills | Status: DC | PRN

## 2019-05-19 MED ORDER — FLUOXETINE HCL 20 MG PO TABS: 20.0000 mg | ORAL_TABLET | Freq: Every day | ORAL | 3 refills | Status: DC

## 2019-05-19 MED FILL — hydrOXYzine HCL 25 MG TABS: 25 | 30 days supply | Qty: 90 | Fill #0

## 2019-05-19 MED FILL — FLUoxetine HCL 20 MG CAPS: 20 | 30 days supply | Qty: 30 | Fill #0

## 2019-05-19 NOTE — Assessment & Plan Note (Signed)
The GAD-7 score of 8.  She was informed that Xanax is not the recommended therapy for the treatment of general anxiety disorder.  She was informed that this medication can have serious side effects and interactions with alcohol.  She is informed that a better treatment regimen would include an SSRI daily and therapy.  She was amenable to this suggestion and also agreeable to looking into therapy resources. -Fluoxetine 20 mg daily -Hydroxyzine 25 mg 3 times daily as needed (in an effort to help replace Xanax) -Discontinue Xanax -Follow-up in 4 weeks

## 2019-05-19 NOTE — Progress Notes (Signed)
[facility] Telemedicine Visit  Patient consented to have virtual visit. Method of visit: Video  Encounter participants: Patient: Dorothy Schwartz - located in her, Provider: Matilde Haymaker - located at [facility] Others (if applicable): None  Chief Complaint: Anxiety  HPI:  Anxiety She reports increasing anxiety for at least the past 3 months.  With all of the changes to her life that have resulted from the coronavirus, she feels that she has not been coping very well with her stress.more than anything else, she feels an overwhelming sense of dread that something awful might soon have been.  She has been self-medicating with Xanax at home and takes roughly 1 mg/day.  She did not want to say where she acquired the Xanax.  In addition to her medication was Xanax, she tries breathing methods and self soothing that she has learned on her own.  She is interested in medication and therapeutic resources.   ROS: per HPI  Pertinent PMHx: None  Exam:  Respiratory: Breathing comfortably on room air.  No evidence of respiratory distress per conversation.  GAD-7: Total score 8   Assessment/Plan:  Anxiety The GAD-7 score of 8.  She was informed that Xanax is not the recommended therapy for the treatment of general anxiety disorder.  She was informed that this medication can have serious side effects and interactions with alcohol.  She is informed that a better treatment regimen would include an SSRI daily and therapy.  She was amenable to this suggestion and also agreeable to looking into therapy resources. -Fluoxetine 20 mg daily -Hydroxyzine 25 mg 3 times daily as needed (in an effort to help replace Xanax) -Discontinue Xanax -Follow-up in 4 weeks    Time spent during visit with patient: 15 minutes

## 2019-05-24 ENCOUNTER — Telehealth: Payer: Self-pay | Admitting: *Deleted

## 2019-05-24 DIAGNOSIS — F419 Anxiety disorder, unspecified: Secondary | ICD-10-CM

## 2019-05-24 MED ORDER — FLUOXETINE HCL 20 MG PO CAPS: 20.0000 mg | ORAL_CAPSULE | Freq: Every day | ORAL | 3 refills | Status: DC

## 2019-05-24 NOTE — Telephone Encounter (Signed)
Fax received from pharmacy that prozac tablets are not covered by the insurance and they would cover the capsule form.  Script changed and resent to pharmacy. Ailin Rochford,CMA

## 2019-07-23 ENCOUNTER — Encounter: Payer: Self-pay | Admitting: Family Medicine

## 2019-07-23 ENCOUNTER — Ambulatory Visit: Payer: Medicaid Other | Attending: Internal Medicine

## 2019-07-23 ENCOUNTER — Telehealth (INDEPENDENT_AMBULATORY_CARE_PROVIDER_SITE_OTHER): Payer: Medicaid Other | Admitting: Family Medicine

## 2019-07-23 ENCOUNTER — Other Ambulatory Visit: Payer: Self-pay

## 2019-07-23 DIAGNOSIS — J069 Acute upper respiratory infection, unspecified: Secondary | ICD-10-CM | POA: Insufficient documentation

## 2019-07-23 DIAGNOSIS — I10 Essential (primary) hypertension: Secondary | ICD-10-CM | POA: Diagnosis not present

## 2019-07-23 DIAGNOSIS — Z20822 Contact with and (suspected) exposure to covid-19: Secondary | ICD-10-CM

## 2019-07-23 NOTE — Assessment & Plan Note (Signed)
Appears to be mild today.  Due to predominance of Covid and patient symptoms, this is Covid until proven otherwise.  Patient was counseled to get tested and given the number to start this process.  She was also counseled to keep all family members at home for 10 days following her and their symptoms.  A work note was provided to excuse her for the required time for quarantine after likely Covid diagnosis.  Even if her Covid test is negative, her symptoms are concerning enough that she should follow CDC guidelines for Covid quarantine.  She was encouraged to use supportive therapy such as Tylenol, Zyrtec, hot tea and honey for her symptoms and to call us or go to the emergency department if she has continual shortness of breath that is not related to anxiety

## 2019-07-23 NOTE — Progress Notes (Signed)
[facility] Telemedicine Visit  Patient consented to have virtual visit. Method of visit: Video  Encounter participants: Patient: Dorothy Schwartz - located at home Provider: Kathrene Alu - located at [facility] Others (if applicable): None  Chief Complaint: Cough and congestion  HPI:  Cough and congestion Symptoms started on 2/23 Has had occasional dry cough that became productive and chest tightness and congestion Has not tried anything yet because NyQuil and many other cough medicines make her have heart palpitations. Has had some shortness of breath, but thinks this is likely due to anxiety and chronic cough Her symptoms have stayed the same since 2/23 No fever, chest pain, loss of taste or smell Patient does smoke currently Her son has similar symptoms and attends school, she works with the public  Hypertension Blood pressure today at home was 147/101 Is prescribed amlodipine 5 mg daily but does not take it regularly Is trying to take it more regularly No headaches or blurry vision   ROS: per HPI  Pertinent PMHx: Tobacco use disorder, hypertension, anxiety  Exam:  General: Well-appearing, appears stated age, no apparent distress Respiratory: Patient has normal work of breathing and no cough throughout our conversation  Assessment/Plan:  URI with cough and congestion Appears to be mild today.  Due to predominance of Covid and patient symptoms, this is Covid until proven otherwise.  Patient was counseled to get tested and given the number to start this process.  She was also counseled to keep all family members at home for 10 days following her and their symptoms.  A work note was provided to excuse her for the required time for quarantine after likely Covid diagnosis.  Even if her Covid test is negative, her symptoms are concerning enough that she should follow CDC guidelines for Covid quarantine.  She was encouraged to use supportive therapy such as  Tylenol, Zyrtec, hot tea and honey for her symptoms and to call us or go to the emergency department if she has continual shortness of breath that is not related to anxiety  HTN (hypertension) Patient was encouraged to continue amlodipine 5 mg daily and continue to take your blood pressure once per day.  If her blood pressure remains elevated, she would benefit from doubling this dose of amlodipine to 10 mg.  Her increased blood pressure may be due to her current illness and might improve once she is feeling better as well.    Time spent during visit with patient: 13 minutes

## 2019-07-23 NOTE — Assessment & Plan Note (Addendum)
Uncontrolled.  Patient was encouraged to continue amlodipine 5 mg daily and continue to take your blood pressure once per day.  If her blood pressure remains elevated, she would benefit from doubling this dose of amlodipine to 10 mg.  Her increased blood pressure may be due to her current illness and might improve once she is feeling better as well.

## 2019-07-24 LAB — NOVEL CORONAVIRUS, NAA: SARS-CoV-2, NAA: NOT DETECTED

## 2019-08-02 MED FILL — METRONIDAZOLE 500 MG TABS: 500 | 7 days supply | Qty: 14 | Fill #0

## 2019-08-20 ENCOUNTER — Encounter: Payer: Self-pay | Admitting: Family Medicine

## 2019-08-20 ENCOUNTER — Other Ambulatory Visit: Payer: Self-pay

## 2019-08-20 ENCOUNTER — Ambulatory Visit (INDEPENDENT_AMBULATORY_CARE_PROVIDER_SITE_OTHER): Payer: Medicaid Other | Admitting: Family Medicine

## 2019-08-20 ENCOUNTER — Ambulatory Visit: Payer: Medicaid Other

## 2019-08-20 VITALS — BP 130/80 | HR 67 | Ht 63.0 in | Wt 174.5 lb

## 2019-08-20 DIAGNOSIS — R5383 Other fatigue: Secondary | ICD-10-CM | POA: Diagnosis not present

## 2019-08-20 DIAGNOSIS — E162 Hypoglycemia, unspecified: Secondary | ICD-10-CM | POA: Diagnosis not present

## 2019-08-20 MED ORDER — FERROUS SULFATE 324 (65 FE) MG PO TBEC: 1.0000 | DELAYED_RELEASE_TABLET | Freq: Every day | ORAL | 0 refills | Status: DC

## 2019-08-20 NOTE — Patient Instructions (Signed)
It was great seeing you today!  I think your symptoms sound lower suspicious for anemia and possible cause of her iron deficiency.  We did get some blood work to better evaluate this.  I sent in iron tablets to your pharmacy.  I will give you call the results come back.  I gave you a list of ophthalmologist in the area to take Medicare, he can give them a call and see if they are the right fit for you.  Lastly you can go to [email or web address].  There is a search function at the top of the website where you can find the right therapist or counselor for you.

## 2019-08-21 LAB — CBC WITH DIFFERENTIAL/PLATELET
Basophils Absolute: 0.1 10*3/uL (ref 0.0–0.2)
Basos: 1 %
EOS (ABSOLUTE): 0.3 10*3/uL (ref 0.0–0.4)
Eos: 3 %
Hematocrit: 36.2 % (ref 34.0–46.6)
Hemoglobin: 12.6 g/dL (ref 11.1–15.9)
Immature Grans (Abs): 0.1 10*3/uL (ref 0.0–0.1)
Immature Granulocytes: 1 %
Lymphocytes Absolute: 2.8 10*3/uL (ref 0.7–3.1)
Lymphs: 34 %
MCH: 30.5 pg (ref 26.6–33.0)
MCHC: 34.8 g/dL (ref 31.5–35.7)
MCV: 88 fL (ref 79–97)
Monocytes Absolute: 0.6 10*3/uL (ref 0.1–0.9)
Monocytes: 8 %
Neutrophils Absolute: 4.5 10*3/uL (ref 1.4–7.0)
Neutrophils: 53 %
Platelets: 282 10*3/uL (ref 150–450)
RBC: 4.13 x10E6/uL (ref 3.77–5.28)
RDW: 12.6 % (ref 11.7–15.4)
WBC: 8.3 10*3/uL (ref 3.4–10.8)

## 2019-08-21 LAB — IRON,TIBC AND FERRITIN PANEL
Ferritin: 138 ng/mL (ref 15–150)
Iron Saturation: 19 % (ref 15–55)
Iron: 58 ug/dL (ref 27–159)
Total Iron Binding Capacity: 301 ug/dL (ref 250–450)
UIBC: 243 ug/dL (ref 131–425)

## 2019-08-21 LAB — TSH: TSH: 1.53 u[IU]/mL (ref 0.450–4.500)

## 2019-08-22 ENCOUNTER — Encounter: Payer: Self-pay | Admitting: Family Medicine

## 2019-08-22 DIAGNOSIS — E162 Hypoglycemia, unspecified: Secondary | ICD-10-CM | POA: Insufficient documentation

## 2019-08-22 DIAGNOSIS — R5383 Other fatigue: Secondary | ICD-10-CM | POA: Insufficient documentation

## 2019-08-22 NOTE — Progress Notes (Signed)
   CHIEF COMPLAINT / HPI: 29 year old female who presents for cold hands, shaking, and fatigue which occurred previously on day of the visit.  Patient states that she has had this constellation of symptoms happen to her a few times recently and they have all resolved with food such as a candy bar.  Of note the patient states that she is also had very cold hands, progressive fatigue, and "whitish" discoloration of her hands recently.  She is concerned that she may have anemia.  PERTINENT  PMH / PSH:    OBJECTIVE: BP 130/80   Pulse 67   Ht 5\' 3"  (1.6 m)   Wt 174 lb 8 oz (79.2 kg)   LMP 07/12/2019   SpO2 97%   BMI 30.91 kg/m   Gen: Very pleasant 29 year old Caucasian female, no acute distress HEENT: No notable pale conjunctiva and eyes or mouth CV: Skin warm and dry Resp: No accessory muscle use, no respiratory distress Neuro: Alert and oriented, Speech clear, No gross deficits   ASSESSMENT / PLAN:  Hypoglycemia Patient likely having frequent hypoglycemic episodes because she does admit that she frequently does not eat breakfast and will go long period of time without eating.  I did counsel her on eating more frequent meals to avoid this problem, and always have a snack on her.  Fatigue Patient symptoms suspicious for iron deficiency anemia.  We will get iron panel, CBC to better evaluate.  I do wonder if there is perhaps a component of her hypoglycemia and frequent skip meals at play.  Will supplement with ferrous sulfate if has IDA.     Guadalupe Dawn, MD [place]

## 2019-08-22 NOTE — Assessment & Plan Note (Signed)
Patient likely having frequent hypoglycemic episodes because she does admit that she frequently does not eat breakfast and will go long period of time without eating.  I did counsel her on eating more frequent meals to avoid this problem, and always have a snack on her.

## 2019-08-22 NOTE — Assessment & Plan Note (Signed)
Patient symptoms suspicious for iron deficiency anemia.  We will get iron panel, CBC to better evaluate.  I do wonder if there is perhaps a component of her hypoglycemia and frequent skip meals at play.  Will supplement with ferrous sulfate if has IDA.

## 2019-08-25 ENCOUNTER — Telehealth: Payer: Self-pay

## 2019-08-25 NOTE — Telephone Encounter (Signed)
Dr. Kris Mouton was the provider for this visit please send this message to him.  Matilde Haymaker, MD

## 2019-08-25 NOTE — Telephone Encounter (Signed)
Patient calls nurse line requesting test results from 08/20/19. Unable to find letter or result note. Please advise  Talbot Grumbling, RN

## 2019-12-30 MED FILL — HYDROCODON-APAP 5-325: 5-325 | 3 days supply | Qty: 15 | Fill #0

## 2019-12-30 MED FILL — AMOXICILLIN 500 MG CAPSULE: 500 | 7 days supply | Qty: 21 | Fill #0

## 2020-02-28 ENCOUNTER — Ambulatory Visit (INDEPENDENT_AMBULATORY_CARE_PROVIDER_SITE_OTHER): Payer: Medicaid Other | Admitting: Family Medicine

## 2020-02-28 ENCOUNTER — Other Ambulatory Visit: Payer: Self-pay

## 2020-02-28 ENCOUNTER — Encounter: Payer: Self-pay | Admitting: Family Medicine

## 2020-02-28 VITALS — BP 144/98 | HR 83 | Ht 63.0 in | Wt 178.4 lb

## 2020-02-28 DIAGNOSIS — F17200 Nicotine dependence, unspecified, uncomplicated: Secondary | ICD-10-CM | POA: Diagnosis not present

## 2020-02-28 DIAGNOSIS — I10 Essential (primary) hypertension: Secondary | ICD-10-CM

## 2020-02-28 DIAGNOSIS — R87619 Unspecified abnormal cytological findings in specimens from cervix uteri: Secondary | ICD-10-CM

## 2020-02-28 MED ORDER — AMLODIPINE BESYLATE 5 MG PO TABS: 5.0000 mg | ORAL_TABLET | Freq: Every day | ORAL | 0 refills | Status: DC

## 2020-02-28 NOTE — Progress Notes (Signed)
    SUBJECTIVE:  CHIEF COMPLAINT / HPI:   Hypertension Patient presenting today for hypertension follow-up.  Patient reports that her blood pressures at home have been up to 786'L systolic.  On Thursday, patient reports blood pressure of 175/110.  Today, 175/119 prior to coming into the appointment. Patient reports episodes where patient gets dizziness, headache, visual changes (floater in left eye), face getting really hot and red that have been about 4 times a week.  She takes BC powder and typically has resolution.  She does report history of stroke in maternal grandmother and multiple TIAs in mom (seen on imaging).  She has been treated with amlodipine in the past but is noncompliant with this medication.  Patient also is an active smoker.  Patient also reports heart palpitations.  She had a previous Holter monitor in 2018 that showed some sinus tachycardia, sinus arrhythmia and PVCs. Patient denies any changes in speech, sudden loss of motor or sensation during these episodes. She also reports history of anxiety which she is currently taking a benzodiazepine for that is not prescribed for her.  She has been seen in the past and encouraged to start on SSRI.  PERTINENT  PMH / PSH: Anxiety   OBJECTIVE:  BP (!) 144/98   Pulse 83   Ht 5\' 3"  (1.6 m)   Wt 178 lb 6.4 oz (80.9 kg)   SpO2 96%   BMI 31.60 kg/m   General: NAD, non-toxic, well-appearing, sitting comfortably in chair    HEENT: [place]/AT. PERRLA. EOMI.  Cardiovascular: RRR, normal S1, S2. B/L 2+ RP. No BLEE Respiratory: CTAB. No IWOB.  Abdomen: + BS. NT, ND, soft to palpation.  Extremities: Warm and well perfused. Moving spontaneously.  Integumentary: No obvious rashes, lesions, trauma on general exam. Neuro: A & O x4. CN grossly intact. No FND  ASSESSMENT/PLAN:  HTN (hypertension) Patient's hypertension may be very poorly controlled.  She has been taking her blood pressures when she is having these episodes of dizziness and  headache.  I have encouraged her to take her blood pressures at the same time every day when she is not having these episodes.  Write them down and bring them back to the next appointment.   The symptoms that she is having can also be due to anxiety or stress, as patient reports a great amount of it at this time.  Her anxiety is not well controlled and she is using someone else's benzodiazepines.  Discussed starting an SSRI or SNRI and recommended discontinuing benzodiazepine. Provided patient list of counselers in the area.   -Floater in left eye: Referral to ophthalmology in the setting of long standing untreated hypertension -Strict return precautions to the emergency department, red flags and blood pressure parameters reviewed  -Start amlodipine 5 mg daily.  Follow-up in 2 weeks for blood pressure check. -Bring blood pressure cuff to next appointment for calibration, or buy new blood pressure cuff - counseled tobacco cessation  - continue to monitor symptoms/episodes while assessing BP. Consider follow up if continuation after BP controlled   Tobacco use disorder Counseled on cessation   Abnormal cervical Papanicolaou smear Patient due to PAP this month. She declined obtaining at today;s visit and will make a follow up appt.     Wilber Oliphant, MD [place]

## 2020-02-28 NOTE — Patient Instructions (Addendum)
High Blood Pressure - restarting amlodipine  - providing a list of counselors (see below) for anxiety  - Check your Bps at the same time every day and write down on a piece of paper and bring to your next visit  - try to quit smoking  - come back in 2-4 weeks for BP follow up  - we always have a physician on call if you have any questions about your symptoms  - sending you to ophthalmology    Therapy and Counseling Resources Most providers on this list will take Medicaid. Patients with commercial insurance or Medicare should contact their insurance company to get a list of in network providers.  [facility]:Psychiatry and Counseling [address] [facility] [place]. [place], [place] [address] [place]  [address] [place], [place] [address]      [place] [address]  [place], [place] [address] [phone]  [place] [address], [place]  [place], [place] [address]       [phone]      Jinny Blossom [facility] [address], [place], [place]  Family Solutions:  [place]. [place] [phone]  [facility] Counseling:  [place] [address] [place] [phone]  [facility] (under & uninsured) [address], [place] [phone]    [email or web address]    [place] [address] . [place]    [phone]  [facility] [place]     Phone:  [phone]     [place]  [place] [address]      [place], [place] ext [place]: [place], [place], [place]   [place] ([place] therapist) [address] [place] [address]   [place] [address]    [phone]    [facility]   [address],  [place], [place]   [place]  [place]  [facility]  [address] [place], [place]        [phone]  Open Access/Walk In Clinic under & uninsured  [facility]  [address] [place], [place] Crisis [phone]  [facility] of the [place],  ([place])   [place], [place]: [phone]) 8:30 - 12; 1 - 2:30  Family Service of the [place],  [place], [place]    [phone]):8:30 - 12; 2 - 3PM  [facility] [employer],  [address],  [place]; [phone]):   Mon - Fri 8 AM - 5 PM  [facility] [place]  MWF 12:30 to 3:00 or call to schedule an appointment  [phone]  Specific Provider options Psychology Today  [email or web address] 1. click on find a therapist  2. enter your zip code 3. left side and select or tailor a therapist for your specific need.   [facility] Provider Directory [email or web address]  (Medicaid)   Follow all drop down to find a provider  [place] [phone] or [email or web address] [address], [place], [place] Recovery support and educational   24- Hour Availability:  .  Marland Kitchen  [facility]  . [place], [place] Crisis [phone]  . Family Service of the [employer] [phone]  [facility] Crisis Service  [phone]   . [place]  [phone] (after hours)  . Therapeutic Alternative/Mobile Crisis   [phone]  . [place] National Suicide Hotline  [phone] ([place])  . Call 911 or go to emergency room  . [employer]  [phone]);  [place] and [employer]   . [facility] ACCESS  [phone]); [place], [place], [place],  [place], [place], [place], [place]

## 2020-02-29 NOTE — Assessment & Plan Note (Signed)
Counseled on cessation 

## 2020-02-29 NOTE — Assessment & Plan Note (Signed)
Patient due to PAP this month. She declined obtaining at today;s visit and will make a follow up appt.

## 2020-02-29 NOTE — Assessment & Plan Note (Addendum)
Patient's hypertension may be very poorly controlled.  She has been taking her blood pressures when she is having these episodes of dizziness and headache.  I have encouraged her to take her blood pressures at the same time every day when she is not having these episodes.  Write them down and bring them back to the next appointment.   The symptoms that she is having can also be due to anxiety or stress, as patient reports a great amount of it at this time.  Her anxiety is not well controlled and she is using someone else's benzodiazepines.  Discussed starting an SSRI or SNRI and recommended discontinuing benzodiazepine. Provided patient list of counselers in the area.   -Floater in left eye: Referral to ophthalmology in the setting of long standing untreated hypertension -Strict return precautions to the emergency department, red flags and blood pressure parameters reviewed  -Start amlodipine 5 mg daily.  Follow-up in 2 weeks for blood pressure check. -Bring blood pressure cuff to next appointment for calibration, or buy new blood pressure cuff - counseled tobacco cessation  - continue to monitor symptoms/episodes while assessing BP. Consider follow up if continuation after BP controlled

## 2020-03-14 ENCOUNTER — Ambulatory Visit (INDEPENDENT_AMBULATORY_CARE_PROVIDER_SITE_OTHER): Payer: Medicaid Other | Admitting: Family Medicine

## 2020-03-14 ENCOUNTER — Telehealth: Payer: Self-pay

## 2020-03-14 ENCOUNTER — Other Ambulatory Visit: Payer: Self-pay

## 2020-03-14 ENCOUNTER — Other Ambulatory Visit: Payer: Self-pay | Admitting: Family Medicine

## 2020-03-14 DIAGNOSIS — I10 Essential (primary) hypertension: Secondary | ICD-10-CM | POA: Diagnosis not present

## 2020-03-14 DIAGNOSIS — F419 Anxiety disorder, unspecified: Secondary | ICD-10-CM | POA: Diagnosis not present

## 2020-03-14 MED ORDER — AMLODIPINE BESYLATE 10 MG PO TABS: 10.0000 mg | ORAL_TABLET | Freq: Every day | ORAL | 2 refills | Status: DC

## 2020-03-14 MED ORDER — SERTRALINE HCL 25 MG PO TABS: 25.0000 mg | ORAL_TABLET | Freq: Every day | ORAL | 0 refills | Status: DC

## 2020-03-14 MED FILL — SERTRALINE HCL 25 MG TABLET: 25 | 30 days supply | Qty: 30 | Fill #0

## 2020-03-14 NOTE — Telephone Encounter (Signed)
Received phone call from [facility] at [place] regarding zoloft prescription. Per Estill Bamberg, Dr. Pilar Plate is not a medicaid authorized provider for this medication and is requiring an attending provider to authorize prescription.   Will forward to Dr. Owens Shark.   Please advise  Talbot Grumbling, RN

## 2020-03-14 NOTE — Assessment & Plan Note (Addendum)
Mildly elevated blood pressure today at 140/90. -Increase to amlodipine 10 mg daily -Follow-up in clinic in 1 month

## 2020-03-14 NOTE — Telephone Encounter (Signed)
Rx to [facility] outpatient pharmacy.  Thank you, Dorris Singh, MD  [facility] Medicine Teaching Service

## 2020-03-14 NOTE — Progress Notes (Signed)
° ° °  SUBJECTIVE:   CHIEF COMPLAINT / HPI:   Blood pressure She reports that she has been consistently taking her amlodipine 5 mg every night. She has not been having any trouble with lightheadedness or dizziness. She has requested to take her blood pressure medicine in the morning if that is safe and appropriate.  Anxiety A total score of 8 on her GAD-7 today. She is previously provided with information about reaching out to therapist but has not yet connected or establish care with a therapist. She reports that that is very difficult for her because she currently works 60 hours a week in she simply does not have time to work with a therapist right now. She is interested in other medicine to treat her anxiety. She is currently taking Xanax 1 mg daily. She has been taking Xanax for about 6 years now. She obtained this Xanax illicitly but consistently. She has previously attempted to start an SSRI but stopped due to significant withdrawal symptoms from discontinuing Xanax at the same time.  PERTINENT  PMH / PSH: Anxiety  OBJECTIVE:   BP 140/90    Pulse 80    Ht 5\' 3"  (1.6 m)    Wt 173 lb 8 oz (78.7 kg)    LMP 03/09/2020    SpO2 97%    BMI 30.73 kg/m    General: Alert and cooperative and appears to be in no acute distress Cardio: Normal S1 and S2, no S3 or S4. Rhythm is regular. No murmurs or rubs.   Pulm: Clear to auscultation bilaterally, no crackles, wheezing, or diminished breath sounds. Normal respiratory effort Abdomen: Bowel sounds normal. Abdomen soft and non-tender.  Extremities: No peripheral edema. Warm/ well perfused.  Strong radial pulse.  ASSESSMENT/PLAN:   Anxiety Not interested in establishing care with a therapist at this time. -Begin tapering Xanax 1 mg every other day (not prescribed but this will help avoid withdrawal symptoms) -Begin taking sertraline daily -Follow-up telemedicine visit in 2 weeks to totally discontinue Xanax -Plan for follow-up in office visit in 4  weeks to consider up titration of sertraline  HTN (hypertension) Mildly elevated blood pressure today at 140/90. -Increase to amlodipine 10 mg daily -Follow-up in clinic in 1 month     Matilde Haymaker, MD [place]

## 2020-03-14 NOTE — Assessment & Plan Note (Signed)
Not interested in establishing care with a therapist at this time. -Begin tapering Xanax 1 mg every other day (not prescribed but this will help avoid withdrawal symptoms) -Begin taking sertraline daily -Follow-up telemedicine visit in 2 weeks to totally discontinue Xanax -Plan for follow-up in office visit in 4 weeks to consider up titration of sertraline

## 2020-03-14 NOTE — Patient Instructions (Addendum)
Anxiety: The people experience the most benefit further anxiety to both therapy and use medication.  I highly recommend that you stop taking the 1 mg of Xanax daily.  For now, with space you to 1 mg every other day for the next 2 weeks to help avoid withdrawal symptoms.  I am also going to start you on a medication called sertraline.  Lets touch base with a video visit in 2 weeks to make sure you are doing okay.  Then will bring you into clinic 2 weeks after that.  Blood pressure: Lets increase your blood pressure medicine to 10 mg daily.  We will plan to recheck your blood pressure when you come back to clinic in 1 month.

## 2020-03-27 ENCOUNTER — Telehealth (INDEPENDENT_AMBULATORY_CARE_PROVIDER_SITE_OTHER): Payer: Medicaid Other | Admitting: Family Medicine

## 2020-03-27 ENCOUNTER — Other Ambulatory Visit: Payer: Self-pay

## 2020-03-27 NOTE — Progress Notes (Signed)
Called patient to do her work up prior to the visit and she would like to cancel due to not having started her anxiety medication.  This was the reason for her visit today.  Will ask provider to close encounter.  Dorothy Schwartz,CMA

## 2020-04-10 ENCOUNTER — Ambulatory Visit: Payer: Medicaid Other | Admitting: Family Medicine

## 2020-04-27 ENCOUNTER — Other Ambulatory Visit: Payer: Self-pay

## 2020-04-27 ENCOUNTER — Ambulatory Visit (HOSPITAL_COMMUNITY)
Admission: EM | Admit: 2020-04-27 | Discharge: 2020-04-27 | Disposition: A | Discharge status: Home / Self Care | Payer: Medicaid Other | Attending: Family Medicine | Admitting: Family Medicine

## 2020-04-27 ENCOUNTER — Encounter (HOSPITAL_COMMUNITY): Payer: Self-pay | Admitting: Emergency Medicine

## 2020-04-27 DIAGNOSIS — N390 Urinary tract infection, site not specified: Secondary | ICD-10-CM | POA: Diagnosis not present

## 2020-04-27 DIAGNOSIS — N3001 Acute cystitis with hematuria: Secondary | ICD-10-CM | POA: Diagnosis not present

## 2020-04-27 LAB — POCT URINALYSIS DIPSTICK, ED / UC
Bilirubin Urine: NEGATIVE
Glucose, UA: NEGATIVE mg/dL
Ketones, ur: NEGATIVE mg/dL
Nitrite: POSITIVE — AB
Protein, ur: 30 mg/dL — AB
Specific Gravity, Urine: 1.015 (ref 1.005–1.030)
Urobilinogen, UA: 0.2 mg/dL (ref 0.0–1.0)
pH: 6.5 (ref 5.0–8.0)

## 2020-04-27 MED ORDER — NITROFURANTOIN MONOHYD MACRO 100 MG PO CAPS: 100.0000 mg | ORAL_CAPSULE | Freq: Two times a day (BID) | ORAL | 0 refills | Status: DC

## 2020-04-27 NOTE — ED Triage Notes (Signed)
Patient reports symptoms started today.  Sensation of having to urinate, but unable to do so.  Patient reports blood in urine.  Patient has had pain across lower back.  Back pain is somewhat better.

## 2020-04-27 NOTE — Discharge Instructions (Addendum)
Macrobid twice daily for 10 days prescribed for UTI.  Your urine culture is pending.  If your culture shows a density sensitive to the antibiotic you are prescribed we will send a different antibiotic over to the pharmacy and you will not be required to come back into the office.  Force fluids to help clear bacteria from urine.  If symptoms of nausea, vomiting or fever develop go immediately to the emergency department.

## 2020-04-27 NOTE — ED Provider Notes (Signed)
[facility]    CSN: [id number] Arrival date & time: 04/27/20  1702      History   Chief Complaint Chief Complaint  Patient presents with  . Urinary Tract Infection    HPI Dorothy Schwartz is a 29 y.o. female.   HPI Patient presents today with acute onset dysuria and urgency times today.  Patient reports when urinating only scant amount of urine is coming out.  She has noticed trace hematuria with urination.  She has had some intermittent back pain of the last few days which has resolved.  She denies fever, nausea or vomiting. Past Medical History:  Diagnosis Date  . Anemia 2011   postop anemia after CS  . Headache(784.0)   . Hypertension   . Kidney stones   . Nephrolithiasis 03/25/2017  . Palpitations    per pt due to stress    Patient Active Problem List   Diagnosis Date Noted  . Hypoglycemia 08/22/2019  . Fatigue 08/22/2019  . URI with cough and congestion 07/23/2019  . Abnormal cervical Papanicolaou smear 04/01/2019  . PCB (post coital bleeding) 03/12/2019  . Bacterial vaginosis 03/12/2019  . Dizzy 11/03/2018  . Anxiety 11/03/2018  . Tobacco use disorder 11/03/2018  . Ankle fracture, left 07/19/2018  . HTN (hypertension) 03/25/2017    Past Surgical History:  Procedure Laterality Date  . CESAREAN SECTION    . CESAREAN SECTION WITH BILATERAL TUBAL LIGATION Bilateral 08/13/2012   Procedure: CESAREAN SECTION WITH BILATERAL TUBAL LIGATION;  Surgeon: Margarette Asal, MD;  Location: [facility];  Service: Obstetrics;  Laterality: Bilateral;  Repeat edc 08/09/12  . ORIF ANKLE FRACTURE Left 07/19/2018   Procedure: OPEN REDUCTION INTERNAL FIXATION (ORIF) ANKLE FRACTURE;  Surgeon: Corky Mull, MD;  Location: [facility];  Service: Orthopedics;  Laterality: Left;  . WISDOM TOOTH EXTRACTION      OB History    Gravida  2   Para  2   Term  2   Preterm      AB      Living  2     SAB      TAB      Ectopic      Multiple      Live Births  1             Home Medications    Prior to Admission medications   Medication Sig Start Date End Date Taking? Authorizing Provider  ALPRAZolam Duanne Moron) 1 MG tablet Take 1 mg by mouth daily.   Yes [provider]  amLODipine (NORVASC) 10 MG tablet Take 1 tablet (10 mg total) by mouth at bedtime. 03/14/20  Yes Matilde Haymaker, MD  nitrofurantoin, macrocrystal-monohydrate, (MACROBID) 100 MG capsule Take 1 capsule (100 mg total) by mouth 2 (two) times daily. 04/27/20   Scot Jun, FNP  sertraline (ZOLOFT) 25 MG tablet Take 1 tablet (25 mg total) by mouth daily. 03/14/20   Martyn Malay, MD    Family History Family History  Problem Relation Age of Onset  . Hypertension Mother   . Mood Disorder Mother   . Lung cancer Mother        tobacco use  . Colon cancer Mother 21  . Colon cancer Father 47  . Lung cancer Maternal Grandmother   . Other Neg Hx     Social History Social History   Tobacco Use  . Smoking status: Current Every Day Smoker    Packs/day: 1.00    Types: Cigarettes  Start date: 2004  . Smokeless tobacco: Never Used  . Tobacco comment: Quit with both pregnancy  Substance Use Topics  . Alcohol use: Yes  . Drug use: Yes    Types: Benzodiazepines     Allergies   Morphine and related and Percocet [oxycodone-acetaminophen]   Review of Systems Review of Systems Pertinent negatives listed in HPI  Physical Exam Triage Vital Signs ED Triage Vitals  Enc Vitals Group     BP 04/27/20 1741 (!) 150/95     Pulse Rate 04/27/20 1741 80     Resp 04/27/20 1741 18     Temp 04/27/20 1741 98.9 F (37.2 C)     Temp Source 04/27/20 1741 Oral     SpO2 04/27/20 1741 97 %     Weight --      Height --      Head Circumference --      Peak Flow --      Pain Score 04/27/20 1737 10     Pain Loc --      Pain Edu? --      Excl. in [place]? --    No data found.  Updated Vital Signs BP (!) 150/95 (BP Location: Right Arm)   Pulse 80   Temp 98.9 F (37.2 C) (Oral)   Resp  18   LMP 03/28/2020   SpO2 97%   Visual Acuity Right Eye Distance:   Left Eye Distance:   Bilateral Distance:    Right Eye Near:   Left Eye Near:    Bilateral Near:     Physical Exam General appearance: alert, well developed, well nourished, cooperative and in no distress Head: Normocephalic, without obvious abnormality, atraumatic Respiratory: Respirations even and unlabored, normal respiratory rate Heart: rate and rhythm normal. No gallop or murmurs noted on exam  Abdomen: BS +, no distention, no rebound tenderness, no CVA tenderness Extremities: No gross deformities Skin: Skin color, texture, turgor normal. No rashes seen  Psych: Appropriate mood and affect. Neurologic: Mental status: Alert, oriented to person, place, and time, thought content appropriate.   UC Treatments / Results  Labs (all labs ordered are listed, but only abnormal results are displayed) Labs Reviewed  POCT URINALYSIS DIPSTICK, ED / UC - Abnormal; Notable for the following components:      Result Value   Hgb urine dipstick LARGE (*)    Protein, ur 30 (*)    Nitrite POSITIVE (*)    Leukocytes,Ua SMALL (*)    All other components within normal limits  URINE CULTURE    EKG   Radiology No results found.  Procedures Procedures (including critical care time)  Medications Ordered in UC Medications - No data to display  Initial Impression / Assessment and Plan / UC Course  I have reviewed the triage vital signs and the nursing notes.  Pertinent labs & imaging results that were available during my care of the patient were reviewed by me and considered in my medical decision making (see chart for details).    UA positive for nitrites and small leuks.  Will treat empirically given symptoms and and exam findings for acute cystitis with Macrobid twice daily for 10 days.  Urine culture pending.  Red flags indicate need to follow-up for higher level of care discussed.  Patient verbalized understanding  agreement with plan. Final Clinical Impressions(s) / UC Diagnoses   Final diagnoses:  Acute cystitis with hematuria     Discharge Instructions     Macrobid twice daily for 10  days prescribed for UTI.  Your urine culture is pending.  If your culture shows a density sensitive to the antibiotic you are prescribed we will send a different antibiotic over to the pharmacy and you will not be required to come back into the office.  Force fluids to help clear bacteria from urine.  If symptoms of nausea, vomiting or fever develop go immediately to the emergency department.   ED Prescriptions    Medication Sig Dispense Auth. Provider   nitrofurantoin, macrocrystal-monohydrate, (MACROBID) 100 MG capsule Take 1 capsule (100 mg total) by mouth 2 (two) times daily. 20 capsule Scot Jun, FNP     PDMP not reviewed this encounter.   Scot Jun, FNP 04/29/20 [phone]

## 2020-04-27 NOTE — ED Notes (Signed)
Sent to bathroom to collect urine specimen

## 2020-04-30 LAB — URINE CULTURE
Culture: >=100000 — AB
Special Requests: NORMAL

## 2020-05-18 ENCOUNTER — Other Ambulatory Visit: Payer: Self-pay | Admitting: Family Medicine

## 2020-05-18 DIAGNOSIS — I10 Essential (primary) hypertension: Secondary | ICD-10-CM

## 2020-07-03 ENCOUNTER — Encounter (HOSPITAL_COMMUNITY): Payer: Self-pay

## 2020-07-03 ENCOUNTER — Ambulatory Visit (HOSPITAL_COMMUNITY)
Admission: EM | Admit: 2020-07-03 | Discharge: 2020-07-03 | Disposition: A | Discharge status: Home / Self Care | Payer: Medicaid Other | Attending: Student | Admitting: Student

## 2020-07-03 ENCOUNTER — Other Ambulatory Visit (HOSPITAL_COMMUNITY): Payer: Self-pay | Admitting: Student

## 2020-07-03 DIAGNOSIS — K644 Residual hemorrhoidal skin tags: Secondary | ICD-10-CM | POA: Diagnosis not present

## 2020-07-03 DIAGNOSIS — K648 Other hemorrhoids: Secondary | ICD-10-CM | POA: Diagnosis not present

## 2020-07-03 DIAGNOSIS — K645 Perianal venous thrombosis: Secondary | ICD-10-CM

## 2020-07-03 MED ORDER — IBUPROFEN 800 MG PO TABS: 800.0000 mg | ORAL_TABLET | Freq: Three times a day (TID) | ORAL | 0 refills | Status: DC

## 2020-07-03 MED ORDER — HYDROCORTISONE (PERIANAL) 2.5 % EX CREA: 1.0000 "application " | TOPICAL_CREAM | Freq: Two times a day (BID) | CUTANEOUS | 1 refills | Status: DC

## 2020-07-03 MED FILL — PROCTOZONE-HC 2.5 % CREA: 2.5 | 30 days supply | Qty: 30 | Fill #0

## 2020-07-03 MED FILL — IBUPROFEN 800 MG TAB: 800 | 7 days supply | Qty: 21 | Fill #0

## 2020-07-03 NOTE — ED Triage Notes (Signed)
Pt c/o an abscess x 1 day on her buttocks. Pt states she she has noticed internal hemorids that have not bothered her before in that area. Marland Kitchen

## 2020-07-03 NOTE — ED Provider Notes (Signed)
[facility]    CSN: [id number] Arrival date & time: 07/03/20  0801      History   Chief Complaint Chief Complaint  Patient presents with  . Abscess    HPI Dorothy Schwartz is a 30 y.o. female presenting with thrombosed external hemorrhoid for 2 days.  History of anemia headache hypertension kidney stones nephrolithiasis palpitations. Patient describes long history of hemorrhoids, both external and internal. She has typically used Preparation H cream and suppositories with relief of symptoms. However today she describes 2 days of progressively worsening external hemorrhoid with swelling and pain. Denies rectal bleeding, BRBPR, blood on toilet paper. Today denies chest pain, shortness of breath, fevers/chills.  HPI  Past Medical History:  Diagnosis Date  . Anemia 2011   postop anemia after CS  . Headache(784.0)   . Hypertension   . Kidney stones   . Nephrolithiasis 03/25/2017  . Palpitations    per pt due to stress    Patient Active Problem List   Diagnosis Date Noted  . Hypoglycemia 08/22/2019  . Fatigue 08/22/2019  . URI with cough and congestion 07/23/2019  . Abnormal cervical Papanicolaou smear 04/01/2019  . PCB (post coital bleeding) 03/12/2019  . Bacterial vaginosis 03/12/2019  . Dizzy 11/03/2018  . Anxiety 11/03/2018  . Tobacco use disorder 11/03/2018  . Ankle fracture, left 07/19/2018  . HTN (hypertension) 03/25/2017    Past Surgical History:  Procedure Laterality Date  . CESAREAN SECTION    . CESAREAN SECTION WITH BILATERAL TUBAL LIGATION Bilateral 08/13/2012   Procedure: CESAREAN SECTION WITH BILATERAL TUBAL LIGATION;  Surgeon: Margarette Asal, MD;  Location: [facility];  Service: Obstetrics;  Laterality: Bilateral;  Repeat edc 08/09/12  . ORIF ANKLE FRACTURE Left 07/19/2018   Procedure: OPEN REDUCTION INTERNAL FIXATION (ORIF) ANKLE FRACTURE;  Surgeon: Corky Mull, MD;  Location: [facility];  Service: Orthopedics;  Laterality: Left;  . WISDOM TOOTH  EXTRACTION      OB History    Gravida  2   Para  2   Term  2   Preterm      AB      Living  2     SAB      IAB      Ectopic      Multiple      Live Births  1            Home Medications    Prior to Admission medications   Medication Sig Start Date End Date Taking? Authorizing Provider  hydrocortisone (ANUSOL-HC) 2.5 % rectal cream Place 1 application rectally 2 (two) times daily. 07/03/20  Yes Hazel Sams, PA-C  ibuprofen (ADVIL) 800 MG tablet Take 1 tablet (800 mg total) by mouth 3 (three) times daily. 07/03/20  Yes Hazel Sams, PA-C  amLODipine (NORVASC) 10 MG tablet Take 1 tablet (10 mg total) by mouth at bedtime. 03/14/20   Matilde Haymaker, MD  amLODipine (NORVASC) 5 MG tablet TAKE 1 TABLET BY MOUTH EVERYDAY AT BEDTIME 05/18/20   Matilde Haymaker, MD  nitrofurantoin, macrocrystal-monohydrate, (MACROBID) 100 MG capsule Take 1 capsule (100 mg total) by mouth 2 (two) times daily. 04/27/20   Scot Jun, FNP  sertraline (ZOLOFT) 25 MG tablet Take 1 tablet (25 mg total) by mouth daily. 03/14/20   Martyn Malay, MD    Family History Family History  Problem Relation Age of Onset  . Hypertension Mother   . Mood Disorder Mother   . Lung cancer Mother  tobacco use  . Colon cancer Mother 94  . Colon cancer Father 84  . Lung cancer Maternal Grandmother   . Other Neg Hx     Social History Social History   Tobacco Use  . Smoking status: Current Every Day Smoker    Packs/day: 1.00    Types: Cigarettes    Start date: 2004  . Smokeless tobacco: Never Used  . Tobacco comment: Quit with both pregnancy  Substance Use Topics  . Alcohol use: Yes  . Drug use: Yes    Types: Benzodiazepines     Allergies   Morphine and related and Percocet [oxycodone-acetaminophen]   Review of Systems Review of Systems  Genitourinary:       Painful hemorrhoid  All other systems reviewed and are negative.    Physical Exam Triage Vital Signs ED Triage  Vitals  Enc Vitals Group     BP 07/03/20 0825 (!) 153/90     Pulse Rate 07/03/20 0825 78     Resp 07/03/20 0825 17     Temp 07/03/20 0825 98.8 F (37.1 C)     Temp Source 07/03/20 0825 Oral     SpO2 07/03/20 0825 98 %     Weight --      Height --      Head Circumference --      Peak Flow --      Pain Score 07/03/20 0824 7     Pain Loc --      Pain Edu? --      Excl. in [place]? --    No data found.  Updated Vital Signs BP (!) 153/90 (BP Location: Right Arm)   Pulse 78   Temp 98.8 F (37.1 C) (Oral)   Resp 17   LMP 06/28/2020 (Approximate)   SpO2 98%   Visual Acuity Right Eye Distance:   Left Eye Distance:   Bilateral Distance:    Right Eye Near:   Left Eye Near:    Bilateral Near:     Physical Exam Vitals reviewed. Exam conducted with a chaperone present.  Constitutional:      Appearance: Normal appearance.  Cardiovascular:     Rate and Rhythm: Normal rate and regular rhythm.     Heart sounds: Normal heart sounds.  Pulmonary:     Effort: Pulmonary effort is normal.     Breath sounds: Normal breath sounds.  Abdominal:     General: Abdomen is flat. There is no distension.     Palpations: Abdomen is soft.     Tenderness: There is no abdominal tenderness. There is no guarding or rebound.  Genitourinary:      Comments: 1cm x1.5cm thrombosed external hemorrhoid at 3 o'clock. Firm and tender to palpation. Not bleeding.  Neurological:     General: No focal deficit present.     Mental Status: She is alert and oriented to person, place, and time.  Psychiatric:        Mood and Affect: Mood normal.        Behavior: Behavior normal.        Thought Content: Thought content normal.        Judgment: Judgment normal.      UC Treatments / Results  Labs (all labs ordered are listed, but only abnormal results are displayed) Labs Reviewed - No data to display  EKG   Radiology No results found.  Procedures Procedures (including critical care time)  Medications  Ordered in UC Medications - No data to display  Initial Impression / Assessment and Plan / UC Course  I have reviewed the triage vital signs and the nursing notes.  Pertinent labs & imaging results that were available during my care of the patient were reviewed by me and considered in my medical decision making (see chart for details).      afebrile nontachycardic nontachypneic, oxygenating well on room air.   For thrombosed external hemorrhoid, anusol and epsom salt baths at below. No overlying infection, no rectal bleeding observed today.   She will call surgery today and schedule f/u with them.   Spent over 40 minutes obtaining H&P, performing physical, discussing results, treatment plan and plan for follow-up with patient. Patient agrees with plan.     Final Clinical Impressions(s) / UC Diagnoses   Final diagnoses:  Thrombosed hemorrhoids  Internal hemorrhoid  External hemorrhoid     Discharge Instructions     -Epsom salt bath 2x daily  -Follow this with applying thick layer of Anusol on top  -Ibuprofen 800mg  up to 3x daily with food  -Follow up with surgery later this week. Call them today and schedule an appointment for this.     ED Prescriptions    Medication Sig Dispense Auth. Provider   hydrocortisone (ANUSOL-HC) 2.5 % rectal cream Place 1 application rectally 2 (two) times daily. 30 g Hazel Sams, PA-C   ibuprofen (ADVIL) 800 MG tablet Take 1 tablet (800 mg total) by mouth 3 (three) times daily. 21 tablet Hazel Sams, PA-C     PDMP not reviewed this encounter.   Hazel Sams, PA-C 07/03/20 [phone]

## 2020-07-03 NOTE — Discharge Instructions (Addendum)
-  Epsom salt bath 2x daily  -Follow this with applying thick layer of Anusol on top  -Ibuprofen 800mg  up to 3x daily with food  -Follow up with surgery later this week. Call them today and schedule an appointment for this.

## 2020-07-06 ENCOUNTER — Telehealth: Payer: Self-pay

## 2020-07-06 ENCOUNTER — Other Ambulatory Visit: Payer: Self-pay

## 2020-07-06 ENCOUNTER — Ambulatory Visit (INDEPENDENT_AMBULATORY_CARE_PROVIDER_SITE_OTHER): Payer: Medicaid Other | Admitting: Family Medicine

## 2020-07-06 VITALS — BP 140/70 | HR 77 | Wt 177.6 lb

## 2020-07-06 DIAGNOSIS — K645 Perianal venous thrombosis: Secondary | ICD-10-CM | POA: Insufficient documentation

## 2020-07-06 NOTE — Telephone Encounter (Signed)
Patient calls nurse line regarding hemorrhoids. Patient was seen in UC on 2/7 due to large hemorrhoid on right side. Patient reports that she has noticed 3-4 quarter sized clots and small amount of rectal bleeding. Patient denies dizziness, lightheadedness or pain. Patient previously scheduled in ATC this afternoon, prior to being transferred to nurse line.   Precepted with Dr. Andria Frames. Dr. Andria Frames agrees that patient can be evaluated in clinic this afternoon.   Talbot Grumbling, RN

## 2020-07-06 NOTE — Patient Instructions (Signed)
Thank you for coming to see me today. It was a pleasure. Today we talked about:   Continue the soaks.  You can also try miralax to keep your bowels soft.  Try to keep the area as clean as possible.  If your pain worsens or you develop fevers, please come back right away.  You don't have to see the surgeon if you continue to improve.  You are due for a Pap Smear.  Please schedule an appointment to come back at your earliest convenience to have this completed.  If you have any questions or concerns, please do not hesitate to call the office at [phone].  Best,   [place] Constable, DO

## 2020-07-06 NOTE — Progress Notes (Signed)
    SUBJECTIVE:   CHIEF COMPLAINT / HPI:   Rectal Bleeding Seen on 2/7 at urgent care Noted to have thrombosed external hemorrhoid Was advised to follow-up with surgery, but was waiting to see if it would improve on its own Has been using soaks, cream, and ibuprofen with good improvement This AM, had normal soft BM and afterwards was passing clots Hemorrhoid is now smaller Wants to make sure that it looks okay Hasn't had any constipation Has never had hemorrhoids prior to this, even when pregnant Otherwise feeling well  PERTINENT  PMH / PSH: HTN, anxiety, tobacco use disorder, abnormal Pap smear with positive HPV  OBJECTIVE:   BP 140/70   Pulse 77   Wt 177 lb 9.6 oz (80.6 kg)   LMP 06/28/2020 (Approximate)   SpO2 98%   BMI 31.46 kg/m    Physical Exam:  General: 30 y.o. female in NAD Lungs: Breathing comfortably on room air Rectal exam: 1.5 x 1 cm elliptical thrombosed external hemorrhoid from 3:00 to 6:00 with opening and small amount of bleeding   ASSESSMENT/PLAN:   Thrombosed external hemorrhoid Discussed with patient that given his draining, will likely continue to bleed for some period of time.  She is not having any symptoms of anemia including chest pain, shortness of breath, dizziness.  Reassured her that this does not require cervical intervention if she is feeling okay.  She can continue with warm compresses.  Given the chronicity, would not recommend an office excision of thrombosis.  Advised her that she can use MiraLAX to soften her stools and prevent straining if needed.  She voiced understanding.  Advised to return to care if she has worsening in her symptoms and especially if she develops fever and has signs of infection.     Dorothy Schwartz, [place]

## 2020-07-06 NOTE — Assessment & Plan Note (Signed)
Discussed with patient that given his draining, will likely continue to bleed for some period of time.  She is not having any symptoms of anemia including chest pain, shortness of breath, dizziness.  Reassured her that this does not require cervical intervention if she is feeling okay.  She can continue with warm compresses.  Given the chronicity, would not recommend an office excision of thrombosis.  Advised her that she can use MiraLAX to soften her stools and prevent straining if needed.  She voiced understanding.  Advised to return to care if she has worsening in her symptoms and especially if she develops fever and has signs of infection.

## 2020-07-13 MED FILL — AMLODIPINE BESYLATE 10 MG T: 10 | 60 days supply | Qty: 60 | Fill #0

## 2020-07-20 ENCOUNTER — Ambulatory Visit: Payer: Medicaid Other | Admitting: Family Medicine

## 2020-07-20 NOTE — Progress Notes (Deleted)
    SUBJECTIVE:   CHIEF COMPLAINT / HPI:   Pap Smear Last Pap smear 03-12-2019, NILM with positive high risk HPV, previous ASCUS with neg HPV, had colpo 03/2019 that was normal, recommended repeat co-testing in 1 year Patient's last menstrual period was 06/28/2020 (approximate). Contraception: ***  Sexually Active: ***  Desire for STD Screening: ***  Last mammogram: *** Self breast exams: ***  Concerns: ***     PERTINENT  PMH / PSH: ***  OBJECTIVE:   LMP 06/28/2020 (Approximate)   ***  ASSESSMENT/PLAN:   No problem-specific Assessment & Plan notes found for this encounter.     Dorothy Schwartz, [place]

## 2020-09-19 ENCOUNTER — Other Ambulatory Visit: Payer: Self-pay

## 2020-09-19 ENCOUNTER — Ambulatory Visit (INDEPENDENT_AMBULATORY_CARE_PROVIDER_SITE_OTHER): Payer: Medicaid Other | Admitting: Family Medicine

## 2020-09-19 ENCOUNTER — Other Ambulatory Visit (HOSPITAL_COMMUNITY)
Admission: RE | Admit: 2020-09-19 | Discharge: 2020-09-19 | Disposition: A | Discharge status: Home / Self Care | Payer: Medicaid Other | Source: Ambulatory Visit | Attending: Family Medicine | Admitting: Family Medicine

## 2020-09-19 VITALS — BP 140/80 | HR 79 | Ht 63.0 in | Wt 179.1 lb

## 2020-09-19 DIAGNOSIS — Z0000 Encounter for general adult medical examination without abnormal findings: Secondary | ICD-10-CM | POA: Diagnosis not present

## 2020-09-19 DIAGNOSIS — F419 Anxiety disorder, unspecified: Secondary | ICD-10-CM | POA: Diagnosis not present

## 2020-09-19 DIAGNOSIS — Z124 Encounter for screening for malignant neoplasm of cervix: Secondary | ICD-10-CM | POA: Diagnosis not present

## 2020-09-19 DIAGNOSIS — R87619 Unspecified abnormal cytological findings in specimens from cervix uteri: Secondary | ICD-10-CM

## 2020-09-19 DIAGNOSIS — I10 Essential (primary) hypertension: Secondary | ICD-10-CM

## 2020-09-19 NOTE — Patient Instructions (Addendum)
Thank you for coming to see me today. It was a pleasure.   We will get some labs today.  If they are abnormal or we need to do something about them, I will call you.  If they are normal, I will send you a message on MyChart (if it is active) or a letter in the mail.  If you don't hear from [place] in 2 weeks, please call the office at the number below.  I will call you with the results of your PAP.  Please follow-up with PCP next week to discuss anxiety maitnenance  If you have any questions or concerns, please do not hesitate to call the office at [phone].  Best,   Carollee Leitz, MD   Outpatient Mental Health Providers (No Insurance required or Self Pay)  [facility]  [phone] [email or web address] [facility] and [facility]  [address] [place], [place] Crisis [phone]  [facility]) can see uninsured folks for outpatient therapy [email or web address]-[email or web address] [address] [place], [place] [address] [phone]  [place] Mon-Fri, 8am-3pm [email or web address], [place], [place]   [place] [phone]- [facility] [place] for psych med management, there may be a wait- if [facility] is working with clients for OPT, they will coordinate with [place] for [place]   Walk-in-Clinic: Monday- Friday 9:00 AM - 4:00 PM [place], [place] [phone]  [facility] [place] ([employer]) walk in M-F 8am-12pm and  1pm-3pm [place]  [place]  Phone: [phone]  [employer] ([place] and substance challenges) [address], [place] [phone]    [email or web address], [place]     Phone:  [phone] [place]  [place]  [place]  [phone] [email or web address]   [facility] ( virtual or zoom therapy) [email or web address]  [place]  [place]    [facility] [phone]  grief counseling, dementia and caregiver support    [facility] Walk-in MWF 12:30 to 3:00     [place] [address]  [phone]  www.ADSyes.org call to schedule an appointment    [place] ,Support group, Peer support services, [address], [place], [place] [address] [phone]- [phone]  [email or web address]           National [facility] on Mental Illness (NAMI) [place]- Wellness classes, Support groups        [address], [place], [place] [address] [phone]   CurrentJokes.cz   [facility]  (Psycho-social Rehabilitation clubhouse, Individual and group therapy) [address], [place] [address]   [phone]  24- Hour Availability:  *[place] or [phone] * [facility] [employer] (Domestic Violence, Rape, etc. )[phone] [place] [phone] or [phone] * [place] [phone] only) [phone] (after hours) *[facility] Crisis [facility] [phone] *[place] National Suicide Hotline ([phone])201-8743 Diamantina Monks)

## 2020-09-19 NOTE — Assessment & Plan Note (Signed)
Continues to have elevated BP.  Inconsistent with taking medication.  Anxiety likely contributing. -Continue Amlodipine 10 mg daily -Bmet today -Follow up 2-4 weeks with  PCP

## 2020-09-19 NOTE — Progress Notes (Signed)
    SUBJECTIVE:   CHIEF COMPLAINT / HPI:   MAPLE ODANIEL is a 30 y.o. female with a history of previous abnormal PAP and positive HPV s/p colposcopy x2 here for an annual gynecological exam.   Gyn concerns/Preventative healthcare  Last menstrual period: Patient's last menstrual period was 08/25/2020.  Regular periods:   Heavy bleeding: no  Sexually active: no  Contraception or hormonal therapy: tubal  Hx of STD: Patient desire STD screening  Dyspareunia: No  Vaginal discharge:   Dysuria:No  Breast mass or concerns: No  Last pap smear: 2020  Anxiety Reports taking Xanax 1 mg daily which seems to be the only thing that helps.  Has tried other medications, Klonopin, Zoloft but did not work.  Has not wanted to work with therapy due to time conststraints.  HTN Intermittent use of BP meds, Amlodipine 10 mg. Denies any chest pain or shortness of breath. No lower extremity edema but some discoloration around ankles.    PERTINENT  PMH / PSH:  Previous abnormal PAP/HPV positive Colpo x 2 Anxiety HTN  OBJECTIVE:   BP 140/80   Pulse 79   Ht 5\' 3"  (1.6 m)   Wt 179 lb 2 oz (81.3 kg)   LMP 08/25/2020   SpO2 97%   BMI 31.73 kg/m    General: Alert, no acute distress Abdomen: Bowel sounds normal. Abdomen soft and non-tender.  Pelvic Exam chaperoned by CMA Tashira        External: normal female genitalia without lesions or masses        Vagina: normal without lesions or masses        Cervix: normal without lesions or masses           ASSESSMENT/PLAN:   Abnormal cervical Papanicolaou smear Previous abnormal PAP with positive HPV and Colposcopy x2.  LMP earlier this month, not currently sexually active. -PAP with HPV genotyping -GC/C, Trich, BV testing today -HIV, RPR and  HepC screening -Will follow up with results   Anxiety Uncontrolled anxiety. Discussed other options for optimal control. Given limited time patient will make appointment with PCP to discuss  other medications. -Offered CCM, patient declined -Mental health resources and crises line provided    -follow up with PCP in 1-2weeks  HTN (hypertension) Continues to have elevated BP.  Inconsistent with taking medication.  Anxiety likely contributing. -Continue Amlodipine 10 mg daily -Bmet today -Follow up 2-4 weeks with  PCP     Carollee Leitz, MD [place]

## 2020-09-19 NOTE — Assessment & Plan Note (Signed)
Previous abnormal PAP with positive HPV and Colposcopy x2.  LMP earlier this month, not currently sexually active. -PAP with HPV genotyping -GC/C, Trich, BV testing today -HIV, RPR and  HepC screening -Will follow up with results

## 2020-09-19 NOTE — Assessment & Plan Note (Signed)
Uncontrolled anxiety. Discussed other options for optimal control. Given limited time patient will make appointment with PCP to discuss other medications. -Offered CCM, patient declined -Mental health resources and crises line provided    -follow up with PCP in 1-2weeks

## 2020-09-20 LAB — CERVICOVAGINAL ANCILLARY ONLY
Bacterial Vaginitis (gardnerella): POSITIVE — AB
Chlamydia: NEGATIVE
Comment: NEGATIVE
Comment: NEGATIVE
Comment: NEGATIVE
Comment: NORMAL
Neisseria Gonorrhea: NEGATIVE
Trichomonas: NEGATIVE

## 2020-09-20 LAB — BASIC METABOLIC PANEL
BUN/Creatinine Ratio: 9 (ref 9–23)
BUN: 7 mg/dL (ref 6–20)
CO2: 23 mmol/L (ref 20–29)
Calcium: 9.7 mg/dL (ref 8.7–10.2)
Chloride: 103 mmol/L (ref 96–106)
Creatinine, Ser: 0.8 mg/dL (ref 0.57–1.00)
Glucose: 91 mg/dL (ref 65–99)
Potassium: 4 mmol/L (ref 3.5–5.2)
Sodium: 140 mmol/L (ref 134–144)
eGFR: 101 mL/min/{1.73_m2} (ref >59)

## 2020-09-20 LAB — HCV INTERPRETATION

## 2020-09-20 LAB — RPR: RPR Ser Ql: NONREACTIVE

## 2020-09-20 LAB — HIV ANTIBODY (ROUTINE TESTING W REFLEX): HIV Screen 4th Generation wRfx: NONREACTIVE

## 2020-09-20 LAB — HCV AB W REFLEX TO QUANT PCR: HCV Ab: <0.1 s/co ratio (ref 0.0–0.9)

## 2020-09-21 LAB — CYTOLOGY - PAP
Comment: NEGATIVE
Diagnosis: NEGATIVE
High risk HPV: NEGATIVE

## 2020-09-23 ENCOUNTER — Encounter: Payer: Self-pay | Admitting: Family Medicine

## 2020-10-02 ENCOUNTER — Ambulatory Visit (INDEPENDENT_AMBULATORY_CARE_PROVIDER_SITE_OTHER): Payer: Medicaid Other | Admitting: Family Medicine

## 2020-10-02 ENCOUNTER — Other Ambulatory Visit (HOSPITAL_COMMUNITY): Payer: Self-pay

## 2020-10-02 ENCOUNTER — Other Ambulatory Visit: Payer: Self-pay

## 2020-10-02 DIAGNOSIS — F419 Anxiety disorder, unspecified: Secondary | ICD-10-CM | POA: Diagnosis not present

## 2020-10-02 DIAGNOSIS — I10 Essential (primary) hypertension: Secondary | ICD-10-CM | POA: Diagnosis present

## 2020-10-02 DIAGNOSIS — Z800 Family history of malignant neoplasm of digestive organs: Secondary | ICD-10-CM | POA: Diagnosis not present

## 2020-10-02 MED ORDER — HYDROXYZINE HCL 25 MG PO TABS
25.0000 mg | ORAL_TABLET | Freq: Three times a day (TID) | ORAL | 0 refills | Status: DC | PRN
  Filled 2020-10-02: qty 15, 5d supply, fill #0

## 2020-10-02 MED ORDER — SERTRALINE HCL 25 MG PO TABS
ORAL_TABLET | Freq: Every day | ORAL | 1 refills | Status: DC
  Filled 2020-10-02: qty 30, 30d supply, fill #0

## 2020-10-02 NOTE — Assessment & Plan Note (Signed)
Pressure dailyWe discussed that many things can elevate blood pressure and it sounds like she may have been recording by [place] blood pressures at home if she were only checking her blood pressure when she felt unwell.  She was encouraged to the for 1 week in a situation where she feels call.  Hopefully, this will demonstrate appropriate blood pressures in the setting.  If these do demonstrate elevated blood pressures, we can consider starting another blood pressure medicine although her blood pressure today indicates she is well controlled.

## 2020-10-02 NOTE — Assessment & Plan Note (Signed)
-  Placed referral to GI for colonoscopy

## 2020-10-02 NOTE — Progress Notes (Signed)
    SUBJECTIVE:   CHIEF COMPLAINT / HPI:   Hypertension -Amlodipine 10 milligrams daily Ms. Noble reports that she takes her blood pressure medicine about 3 days/week.  She is concerned because she occasionally checks her blood pressure at home when she feels a headache or flushed or feels unwell and has found her blood pressure elevated on multiple occasions.  Anxiety GAD-7 score of 4 today.  She feels this is not accurately describe her symptoms.  She has been struggling with anxiety for years, since she was a teenager.  For several years now, she has been getting Xanax from a friend.  She currently self prescribes Xanax 1 mg daily.  She knows this is not an ideal treatment and would like to start a more appropriate treatment and to wean off of her Xanax.  Colon cancer screening She reports that her father was diagnosed with colon cancer around 32 years old.  Additionally, her sister had a colonoscopy at the age of 22 at which time a polyp was found in the GI physician told her that all of her family members be screened for colon cancer in their 84s.  Vision concerns She is previously been referred to ophthalmology for floaters but was not able to make her appointment.  She would like the contact information for her ophthalmologist.  PERTINENT  PMH / [place]: Hypertension, anxiety  OBJECTIVE:   BP 124/76   Pulse 79   Ht 5\' 3"  (1.6 m)   Wt 182 lb (82.6 kg)   LMP 09/11/2020   SpO2 95%   BMI 32.24 kg/m    General: Alert and cooperative and appears to be in no acute distress Respiratory: Breathing comfortably on room air.  No respiratory distress. Neuro: Cranial nerves grossly intact  ASSESSMENT/PLAN:   HTN (hypertension) Pressure dailyWe discussed that many things can elevate blood pressure and it sounds like she may have been recording by [place] blood pressures at home if she were only checking her blood pressure when she felt unwell.  She was encouraged to the for 1 week in a  situation where she feels call.  Hopefully, this will demonstrate appropriate blood pressures in the setting.  If these do demonstrate elevated blood pressures, we can consider starting another blood pressure medicine although her blood pressure today indicates she is well controlled.  Anxiety See AVS for additional information. -Start Zoloft 25 mg daily, increase to 50 mg daily after 2 weeks -Hydroxyzine 25 mg as needed for anxiety -Wean Xanax as noted in AVS -Return to clinic in 4 weeks -Not interested in therapy options today  Family history of colon cancer -Placed referral to GI for colonoscopy   Vision concerns -Provided contact information for ophthalmologist  Matilde Haymaker, MD [place]

## 2020-10-02 NOTE — Assessment & Plan Note (Signed)
See AVS for additional information. -Start Zoloft 25 mg daily, increase to 50 mg daily after 2 weeks -Hydroxyzine 25 mg as needed for anxiety -Wean Xanax as noted in AVS -Return to clinic in 4 weeks -Not interested in therapy options today

## 2020-10-02 NOTE — Patient Instructions (Addendum)
Blood pressure: Your blood pressure is appropriate here in the office.  I think would be helpful to get some normal blood pressures at home.  For the next week, I like you to take your blood pressure once a day either in the morning or the evening when you have time to sit down and stay relaxed for a minute or 2.  Hopefully, this will demonstrate that when you are feeling relaxed, your blood pressure is normal we do not need to add any blood pressure medication.  Anxiety: I think it is very reasonable to start a new anxiety medication today called sertraline.  Start taking this medication every day.  After 2 weeks, I would like you to start taking 2 pills a day.  Additionally, I have given you several pills of hydroxyzine which you can take if you are feeling more severe anxiety and would like more instant relief.  Please come back to clinic in 4 weeks for [place] to reassess.  I would like you to decrease the Xanax that you have been getting.  Start taking half a tablet daily for 1 week followed by half a tablet every other day for 1 week.  Then you can stop taking it completely.  I have placed a referral to GI. you should get a call in the next 1-2 weeks to set up your appointment.  Please let me know if you have not received a call in the next 2 weeks.  Vision concerns: Your previously referred to [facility] care.  The contact number is [phone].

## 2020-10-25 ENCOUNTER — Other Ambulatory Visit: Payer: Self-pay

## 2020-10-25 ENCOUNTER — Ambulatory Visit (INDEPENDENT_AMBULATORY_CARE_PROVIDER_SITE_OTHER): Payer: Medicaid Other | Admitting: Family Medicine

## 2020-10-25 VITALS — BP 148/96 | HR 71 | Temp 99.4°F

## 2020-10-25 DIAGNOSIS — J029 Acute pharyngitis, unspecified: Secondary | ICD-10-CM | POA: Insufficient documentation

## 2020-10-25 DIAGNOSIS — I10 Essential (primary) hypertension: Secondary | ICD-10-CM

## 2020-10-25 LAB — POCT RAPID STREP A (OFFICE): Rapid Strep A Screen: NEGATIVE

## 2020-10-25 NOTE — Patient Instructions (Signed)
It was great to see you today.  Here is a quick review of the things we talked about:   Viral upper respiratory tract infection: Based on history and physical exam today.  I think you most likely have a virus (a common cold).  I would expect it to continue to improve.  I do not think any additional testing is needed at this time.  I think you are safe and appropriate to return to work as long as you feel well enough to work.

## 2020-10-25 NOTE — Assessment & Plan Note (Addendum)
[employer]-pay.  COVID ruled out with home test.  Point-of-care strep test.  She was informed that she appeared to have a viral upper respiratory tract infection.  She is safe and appropriate to return to work as long she feels well.

## 2020-10-25 NOTE — Assessment & Plan Note (Addendum)
Elevated today.  She reports that she is not currently taking her antihypertensive medication.  She was encouraged to start taking her medication and to return to clinic for reassessment.

## 2020-10-25 NOTE — Progress Notes (Signed)
    SUBJECTIVE:   CHIEF COMPLAINT / HPI:   Sore throat Dorothy Schwartz reports that she for started to experience symptoms 1 day ago.  Her bothersome symptoms included sore throat, nasal congestion, fever up to 101.  She has also had a headache and some sinus pressure.  Her fever significant proved with Tylenol and Motrin.  She did have a negative COVID test at home.  She came into clinic today to see if she should have any further assessment to rule out COVID infection.  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  She is breathing comfortably and denies chest pain.  PERTINENT  PMH / PSH: Noncontributory  OBJECTIVE:   BP (!) 148/96   Pulse 71   Temp 99.4 F (37.4 C) (Oral)   SpO2 99%    General: Alert and cooperative and appears to be in no acute distress HEENT: No cervical lymphadenopathy.  Normal TMs visualized bilaterally.  Nasal congestion without significant erythema or swelling of the turbinates.  Mildly erythematous oropharynx without tonsillar enlargement. Cardio: Normal S1 and S2, no S3 or S4. Rhythm is regular. No murmurs or rubs.   Pulm: Clear to auscultation bilaterally, no crackles, wheezing, or diminished breath sounds. Normal respiratory effort Abdomen: Bowel sounds normal. Abdomen soft and non-tender.  Extremities: No peripheral edema. Warm/ well perfused.  Strong radial pulses. Neuro: Cranial nerves grossly intact  ASSESSMENT/PLAN:   Pharyngitis [employer]-pay.  COVID ruled out with home test.  Point-of-care strep test.  She was informed that she appeared to have a viral upper respiratory tract infection.  She is safe and appropriate to return to work as long she feels well.  HTN (hypertension) Elevated today.  She reports that she is not currently taking her antihypertensive medication.  She was encouraged to start taking her medication and to return to clinic for reassessment.     Matilde Haymaker, MD [place]

## 2021-02-21 ENCOUNTER — Ambulatory Visit: Payer: Medicaid Other | Admitting: Family Medicine

## 2021-02-21 NOTE — Progress Notes (Deleted)
     SUBJECTIVE:   CHIEF COMPLAINT / HPI:   Dorothy Schwartz is a 30 y.o. female presents for ***   ***  [place] Office Visit from 10/25/2020 in [place]  PHQ-9 Total Score 0        Health Maintenance Due  Topic   COVID-19 Vaccine (1)   INFLUENZA VACCINE       PERTINENT  PMH / PSH:   OBJECTIVE:   There were no vitals taken for this visit.   General: Alert, no acute distress Cardio: Normal S1 and S2, RRR, no r/m/g Pulm: CTAB, normal work of breathing Abdomen: Bowel sounds normal. Abdomen soft and non-tender.  Extremities: No peripheral edema.  Neuro: Cranial nerves grossly intact   ASSESSMENT/PLAN:   No problem-specific Assessment & Plan notes found for this encounter.    Lattie Haw, MD PGY-3 [place]

## 2021-03-02 ENCOUNTER — Ambulatory Visit: Payer: Medicaid Other | Admitting: Family Medicine

## 2021-03-02 ENCOUNTER — Emergency Department (HOSPITAL_COMMUNITY): Payer: Medicaid Other

## 2021-03-02 ENCOUNTER — Emergency Department (HOSPITAL_COMMUNITY)
Admission: EM | Admit: 2021-03-02 | Discharge: 2021-03-02 | Disposition: A | Discharge status: Home / Self Care | Payer: Medicaid Other | Attending: Emergency Medicine | Admitting: Emergency Medicine

## 2021-03-02 DIAGNOSIS — I10 Essential (primary) hypertension: Secondary | ICD-10-CM | POA: Diagnosis not present

## 2021-03-02 DIAGNOSIS — Z79899 Other long term (current) drug therapy: Secondary | ICD-10-CM | POA: Insufficient documentation

## 2021-03-02 DIAGNOSIS — F17210 Nicotine dependence, cigarettes, uncomplicated: Secondary | ICD-10-CM | POA: Diagnosis not present

## 2021-03-02 DIAGNOSIS — R519 Headache, unspecified: Secondary | ICD-10-CM | POA: Diagnosis present

## 2021-03-02 LAB — I-STAT BETA HCG BLOOD, ED (MC, WL, AP ONLY): I-stat hCG, quantitative: <5 m[IU]/mL (ref <5)

## 2021-03-02 LAB — CBC WITH DIFFERENTIAL/PLATELET
Abs Immature Granulocytes: 0.06 K/uL (ref 0.00–0.07)
Basophils Absolute: 0.1 K/uL (ref 0.0–0.1)
Basophils Relative: 1 %
Eosinophils Absolute: 0.2 K/uL (ref 0.0–0.5)
Eosinophils Relative: 2 %
HCT: 42 % (ref 36.0–46.0)
Hemoglobin: 14.7 g/dL (ref 12.0–15.0)
Immature Granulocytes: 1 %
Lymphocytes Relative: 25 %
Lymphs Abs: 2.5 K/uL (ref 0.7–4.0)
MCH: 29.6 pg (ref 26.0–34.0)
MCHC: 35 g/dL (ref 30.0–36.0)
MCV: 84.7 fL (ref 80.0–100.0)
Monocytes Absolute: 0.8 K/uL (ref 0.1–1.0)
Monocytes Relative: 7 %
Neutro Abs: 6.5 K/uL (ref 1.7–7.7)
Neutrophils Relative %: 64 %
Platelets: 352 K/uL (ref 150–400)
RBC: 4.96 MIL/uL (ref 3.87–5.11)
RDW: 12.1 % (ref 11.5–15.5)
WBC: 10.1 K/uL (ref 4.0–10.5)
nRBC: 0 % (ref 0.0–0.2)

## 2021-03-02 LAB — COMPREHENSIVE METABOLIC PANEL
ALT: 46 U/L — ABNORMAL HIGH (ref 0–44)
AST: 27 U/L (ref 15–41)
Albumin: 4.4 g/dL (ref 3.5–5.0)
Alkaline Phosphatase: 61 U/L (ref 38–126)
Anion gap: 10 (ref 5–15)
BUN: 8 mg/dL (ref 6–20)
CO2: 27 mmol/L (ref 22–32)
Calcium: 9.6 mg/dL (ref 8.9–10.3)
Chloride: 101 mmol/L (ref 98–111)
Creatinine, Ser: 0.67 mg/dL (ref 0.44–1.00)
GFR, Estimated: >60 mL/min (ref >60)
Glucose, Bld: 91 mg/dL (ref 70–99)
Potassium: 3.7 mmol/L (ref 3.5–5.1)
Sodium: 138 mmol/L (ref 135–145)
Total Bilirubin: 0.6 mg/dL (ref 0.3–1.2)
Total Protein: 7.6 g/dL (ref 6.5–8.1)

## 2021-03-02 MED ORDER — ACETAMINOPHEN 500 MG PO TABS
1000.0000 mg | ORAL_TABLET | Freq: Once | ORAL | Status: AC
  Administered 2021-03-02: 1000 mg via ORAL
  Filled 2021-03-02: qty 2

## 2021-03-02 NOTE — ED Notes (Signed)
Pt discharged and ambulated out of the ED without difficulty. 

## 2021-03-02 NOTE — Discharge Instructions (Addendum)
Return for any problem  Follow-up with your regular care provider on Monday as instructed.  Take your blood pressure medication regularly as instructed.

## 2021-03-02 NOTE — ED Provider Notes (Signed)
Emergency Medicine Provider Triage Evaluation Note  Dorothy Schwartz , a 30 y.o. female  was evaluated in triage.  Pt complains of elevated BP.  Review of Systems  Positive: Headache, anxious Negative: Fever, neck stiffness, cp, abd pain, n/v/d, focal numbness, focal weakness  Physical Exam  BP (!) 180/127 (BP Location: Left Arm)   Pulse 81   Temp 98.7 F (37.1 C) (Oral)   Resp 16   SpO2 100%  Gen:   Awake, no distress   Resp:  Normal effort  MSK:   Moves extremities without difficulty  Other:    Medical Decision Making  Medically screening exam initiated at 12:25 PM.  Appropriate orders placed.  CHAD TIZNADO was informed that the remainder of the evaluation will be completed by another provider, this initial triage assessment does not replace that evaluation, and the importance of remaining in the ED until their evaluation is complete.  Pt here with persistent R parietal throbbing headache x 1 week, also noticed BP elevated.  Was seen at [facility] today for her sxs but was recommended to come to the ER for evaluation.  Report her sister had similar headache and was diagnosed with brain tumor recently.  Pt is concerned.    She's well appearing, no focal neuro deficit.  Does have hx of HTN and takes amlodipine. Have been taking ibuprofen and Goodies powder for headache.    Domenic Moras, PA-C 03/02/21 1227    Regan Lemming, MD 03/02/21 [phone]

## 2021-03-02 NOTE — ED Notes (Signed)
Pt states she is dizzy and her head is in excruciating pain. Let triage nurse know.

## 2021-03-02 NOTE — ED Triage Notes (Signed)
Pt reports elevated blood pressure and headache for the last week. Reports right eye droopy since last night. Speech clear, no facial droop, no drift. PT takes amlodipine 5mg  daily. No acute distress noted.

## 2021-03-02 NOTE — ED Provider Notes (Signed)
[facility] EMERGENCY DEPARTMENT Provider Note   CSN: [id number] Arrival date & time: 03/02/21  1122     History Chief Complaint  Patient presents with   Hypertension   Headache    Dorothy Schwartz is a 30 y.o. female.  30 year old female with prior medical history as detailed below presents for evaluation.  Patient reports longstanding history of hypertension.  She also reports longstanding history of anxiety.  Patient reports increased BP over the last several days.  She reports to irregular use of her prescribed antihypertensive.  She also complains of mild headache.  Patient was concerned that she might have something "very wrong" and so came to the ED for evaluation.  She denies chest pain or shortness of breath.  She denies nausea or vomiting.  She reports a significant improvement in her symptoms during the lengthy wait time today.  The history is provided by the patient.  Hypertension This is a chronic problem. The current episode started more than 2 days ago. The problem occurs daily. The problem has not changed since onset.Associated symptoms include headaches. Pertinent negatives include no chest pain and no shortness of breath. Nothing aggravates the symptoms. Nothing relieves the symptoms.  Headache     Past Medical History:  Diagnosis Date   Anemia 2011   postop anemia after CS   Headache(784.0)    Hypertension    Kidney stones    Nephrolithiasis 03/25/2017   Palpitations    per pt due to stress    Patient Active Problem List   Diagnosis Date Noted   Pharyngitis 10/25/2020   Family history of colon cancer 10/02/2020   Thrombosed external hemorrhoid 07/06/2020   Abnormal cervical Papanicolaou smear 04/01/2019   PCB (post coital bleeding) 03/12/2019   Dizzy 11/03/2018   Anxiety 11/03/2018   Tobacco use disorder 11/03/2018   Ankle fracture, left 07/19/2018   HTN (hypertension) 03/25/2017    Past Surgical History:  Procedure Laterality  Date   CESAREAN SECTION     CESAREAN SECTION WITH BILATERAL TUBAL LIGATION Bilateral 08/13/2012   Procedure: CESAREAN SECTION WITH BILATERAL TUBAL LIGATION;  Surgeon: Margarette Asal, MD;  Location: [facility];  Service: Obstetrics;  Laterality: Bilateral;  Repeat edc 08/09/12   ORIF ANKLE FRACTURE Left 07/19/2018   Procedure: OPEN REDUCTION INTERNAL FIXATION (ORIF) ANKLE FRACTURE;  Surgeon: Corky Mull, MD;  Location: [facility];  Service: Orthopedics;  Laterality: Left;   WISDOM TOOTH EXTRACTION       OB History     Gravida  2   Para  2   Term  2   Preterm      AB      Living  2      SAB      IAB      Ectopic      Multiple      Live Births  1           Family History  Problem Relation Age of Onset   Hypertension Mother    Mood Disorder Mother    Lung cancer Mother        tobacco use   Colon cancer Mother 16   Colon cancer Father 73   Lung cancer Maternal Grandmother    Other Neg Hx     Social History   Tobacco Use   Smoking status: Every Day    Packs/day: 1.00    Types: Cigarettes    Start date: 2004   Smokeless tobacco: Never  Tobacco comments:    Quit with both pregnancy  Substance Use Topics   Alcohol use: Yes   Drug use: Yes    Types: Benzodiazepines    Home Medications Prior to Admission medications   Medication Sig Start Date End Date Taking? Authorizing Provider  ALPRAZolam Duanne Moron) 1 MG tablet Take 1 mg by mouth daily.   Yes [provider]  amLODipine (NORVASC) 5 MG tablet Take 5 mg by mouth daily.   Yes [provider]  ibuprofen (ADVIL) 200 MG tablet Take 800 mg by mouth every 8 (eight) hours as needed for headache.   Yes [provider]  amLODipine (NORVASC) 10 MG tablet TAKE 1 TABLET BY MOUTH AT BEDTIME. Patient not taking: Reported on 03/02/2021 03/14/20 03/14/21  Matilde Haymaker, MD  hydrocortisone (ANUSOL-HC) 2.5 % rectal cream APPLY RECTALLY TWICE A DAY Patient not taking: No sig reported 07/03/20 07/03/21   Hazel Sams, PA-C  hydrOXYzine (ATARAX/VISTARIL) 25 MG tablet Take 1 tablet (25 mg total) by mouth 3 (three) times daily as needed for anxiety. Patient not taking: Reported on 03/02/2021 10/02/20   Matilde Haymaker, MD  sertraline (ZOLOFT) 25 MG tablet TAKE 1 TABLET BY MOUTH ONCE DAILY Patient not taking: Reported on 03/02/2021 10/02/20 10/02/21  Matilde Haymaker, MD    Allergies    Morphine and related and Percocet [oxycodone-acetaminophen]  Review of Systems   Review of Systems  Respiratory:  Negative for shortness of breath.   Cardiovascular:  Negative for chest pain.  Neurological:  Positive for headaches.  All other systems reviewed and are negative.  Physical Exam Updated Vital Signs BP (!) 158/103   Pulse 70   Temp 98.6 F (37 C) (Oral)   Resp 15   Ht 5\' 3"  (1.6 m)   Wt 83.9 kg   SpO2 99%   BMI 32.77 kg/m   Physical Exam Vitals and nursing note reviewed.  Constitutional:      General: She is not in acute distress.    Appearance: Normal appearance. She is well-developed.  HENT:     Head: Normocephalic and atraumatic.  Eyes:     General: No visual field deficit.    Conjunctiva/sclera: Conjunctivae normal.     Pupils: Pupils are equal, round, and reactive to light.  Cardiovascular:     Rate and Rhythm: Normal rate and regular rhythm.     Heart sounds: Normal heart sounds.  Pulmonary:     Effort: Pulmonary effort is normal. No respiratory distress.     Breath sounds: Normal breath sounds.  Abdominal:     General: There is no distension.     Palpations: Abdomen is soft.     Tenderness: There is no abdominal tenderness.  Musculoskeletal:        General: No deformity. Normal range of motion.     Cervical back: Normal range of motion and neck supple.  Skin:    General: Skin is warm and dry.  Neurological:     General: No focal deficit present.     Mental Status: She is alert and oriented to person, place, and time. Mental status is at baseline.     Cranial Nerves: No  cranial nerve deficit, dysarthria or facial asymmetry.     Sensory: No sensory deficit.     Motor: No weakness.    ED Results / Procedures / Treatments   Labs (all labs ordered are listed, but only abnormal results are displayed) Labs Reviewed  COMPREHENSIVE METABOLIC PANEL - Abnormal; Notable for the  following components:      Result Value   ALT 46 (*)    All other components within normal limits  CBC WITH DIFFERENTIAL/PLATELET  I-STAT BETA HCG BLOOD, ED ([facility], [facility], [facility] ONLY)    EKG EKG Interpretation  Date/Time:  Friday March 02 2021 12:22:40 EDT Ventricular Rate:  84 PR Interval:  138 QRS Duration: 90 QT Interval:  386 QTC Calculation: 456 R Axis:   71 Text Interpretation: Normal sinus rhythm ST & T wave abnormality, consider inferior ischemia ST & T wave abnormality, consider anterolateral ischemia Abnormal ECG Worsening T wave inversions in the inferior leads compared to prior EKG No STEMI Confirmed by Regan Lemming ([id number]) on 03/02/2021 12:38:21 PM  Radiology CT Head Wo Contrast  Result Date: 03/02/2021 CLINICAL DATA:  Headache, new or worsening (Age >= 50y) EXAM: CT HEAD WITHOUT CONTRAST TECHNIQUE: Contiguous axial images were obtained from the base of the skull through the vertex without intravenous contrast. COMPARISON:  None. FINDINGS: Brain: No evidence of acute intracranial hemorrhage or extra-axial collection.No evidence of mass lesion/concern mass effect.The ventricles are normal in size. Vascular: No hyperdense vessel or unexpected calcification. Skull: Normal. Negative for fracture or focal lesion. Sinuses/Orbits: No acute finding. Other: None. IMPRESSION: No acute intracranial abnormality. Electronically Signed   By: Maurine Simmering M.D.   On: 03/02/2021 14:45    Procedures Procedures   Medications Ordered in ED Medications  acetaminophen (TYLENOL) tablet 1,000 mg (1,000 mg Oral Given 03/02/21 1519)    ED Course  I have reviewed the triage vital signs and the  nursing notes.  Pertinent labs & imaging results that were available during my care of the patient were reviewed by me and considered in my medical decision making (see chart for details).    MDM Rules/Calculators/A&P                           MDM  MSE complete  Dorothy Schwartz was evaluated in Emergency Department on 03/02/2021 for the symptoms described in the history of present illness. She was evaluated in the context of the global COVID-19 pandemic, which necessitated consideration that the patient might be at risk for infection with the SARS-CoV-2 virus that causes COVID-19. Institutional protocols and algorithms that pertain to the evaluation of patients at risk for COVID-19 are in a state of rapid change based on information released by regulatory bodies including the CDC and federal and state organizations. These policies and algorithms were followed during the patient's care in the ED.  Patient is presenting out of concern over elevated BP.  Patient without evidence of endorgan damage  Screening labs obtained are without significant abnormality  Patient is reassured after ED evaluation.  She does understand need for close follow-up.  She understands need for compliance with previously prescribed antihypertensives.  Strict return precautions given and understood.   Final Clinical Impression(s) / ED Diagnoses Final diagnoses:  Hypertension, unspecified type    Rx / DC Orders ED Discharge Orders     None        Valarie Merino, MD 03/02/21 2114

## 2021-03-06 NOTE — Progress Notes (Signed)
    SUBJECTIVE:   CHIEF COMPLAINT / HPI:   Hypertension: 30year-old female presenting for hypertension. Current medication include amlodipine 10 mg daily. Was seen in the emergency department on 10/7 due to concern for her blood pressure being elevated.  She mentioned that she only took her amlodipine irregularly.  Blood pressure was noted at 158/103 in the emergency department.  She checks her blood pressure at home but is not sure if her blood pressure machine is accurate.  She often sees numbers at night after work of 185/120.  Vaginal discharge: Patient with vaginal odor and vaginal discharge over the past week or 2.  Has a history of BV.  She is not sexually active and has been tested for STIs since her last sexual encounter.  She has no concern for STD testing at this time.  States the vaginal discharge is clear in nature.  PERTINENT  PMH / PSH: None relevant  OBJECTIVE:   BP (!) 136/103   Pulse 80   Ht 5\' 3"  (1.6 m)   Wt 187 lb (84.8 kg)   SpO2 99%   BMI 33.13 kg/m    Blood pressure recheck 128/98  General: NAD, pleasant, able to participate in exam Respiratory: No respiratory distress Extremities: no edema or cyanosis. Pelvic exam: VULVA: normal appearing vulva with no masses, tenderness or lesions, VAGINA: No vaginal tenderness, no masses, vaginal discharge - white and scant.  Chaperone: April Neuro: alert, no obvious focal deficits Psych: Normal affect and mood  ASSESSMENT/PLAN:   HTN (hypertension) Patient previously took 10 mg amlodipine but was only taking it occasionally, she has been taking the 5 mg regularly with blood pressure 136/103, 128/98 on recheck. Discussed medication options for the patient.  She has had a tubal ligation in the past and so we have a lot of options for medications.  She endorsed that when she was taking the 10 mg amlodipine she was not taking it regularly though she has been taking the 5 mg regularly since restarting it.  Patient ultimately  decides to try the 10 mg and follow-up in 1 week to see how her blood pressure is doing.  If it is still elevated at that time we can try additional medications.   Vaginal discharge: Patient with few weeks of vaginal odor and discharge.  History of BV.  She has previously had STD testing has not been sexually active since.  Has no interest in STD testing today.  Physical exam with scant amount of whitish discharge present.  Wet prep performed which showed clue cells consistent with BV.  Will initiate a metronidazole for 1 week.   Lurline Del, [place]

## 2021-03-08 ENCOUNTER — Other Ambulatory Visit: Payer: Self-pay | Admitting: *Deleted

## 2021-03-08 ENCOUNTER — Ambulatory Visit (INDEPENDENT_AMBULATORY_CARE_PROVIDER_SITE_OTHER): Payer: Medicaid Other | Admitting: Family Medicine

## 2021-03-08 ENCOUNTER — Other Ambulatory Visit (HOSPITAL_COMMUNITY): Payer: Self-pay

## 2021-03-08 ENCOUNTER — Other Ambulatory Visit: Payer: Self-pay

## 2021-03-08 VITALS — BP 136/103 | HR 80 | Ht 63.0 in | Wt 187.0 lb

## 2021-03-08 DIAGNOSIS — I10 Essential (primary) hypertension: Secondary | ICD-10-CM

## 2021-03-08 DIAGNOSIS — N898 Other specified noninflammatory disorders of vagina: Secondary | ICD-10-CM

## 2021-03-08 LAB — POCT WET PREP (WET MOUNT)
Clue Cells Wet Prep Whiff POC: POSITIVE
Trichomonas Wet Prep HPF POC: ABSENT

## 2021-03-08 MED ORDER — AMLODIPINE BESYLATE 10 MG PO TABS
ORAL_TABLET | Freq: Every day | ORAL | 2 refills | Status: DC
  Filled 2021-03-08: qty 60, 60d supply, fill #0

## 2021-03-08 MED ORDER — METRONIDAZOLE 500 MG PO TABS
500.0000 mg | ORAL_TABLET | Freq: Two times a day (BID) | ORAL | 0 refills | Status: AC
  Filled 2021-03-08: qty 14, 7d supply, fill #0

## 2021-03-08 MED FILL — Amlodipine Besylate Tab 10 MG (Base Equivalent): ORAL | 60 days supply | Qty: 60 | Fill #0 | Status: CN

## 2021-03-08 NOTE — Telephone Encounter (Signed)
Patient was seen today and advised to take her bp medication and follow up for a recheck in 1 week.  Pharmacy called to get a refill as patient informed them that she doesn't have any medication at home.  Will forward to MD.  Dorothy Schwartz

## 2021-03-08 NOTE — Patient Instructions (Signed)
For your blood pressure when she do start the 10 mg amlodipine daily.  I would like for you to follow-up in 1 week for blood pressure recheck.  I strongly recommend that you bring your home blood pressure cuff to compare it against ours as it may be an accurate.  For your vaginal discharge we performed a wet prep which showed BV.  We will treat this with metronidazole for 7 days.  I have sent this to your pharmacy.  Follow-up you have any other questions or concerns.Marland Kitchen

## 2021-03-08 NOTE — Assessment & Plan Note (Signed)
Patient previously took 10 mg amlodipine but was only taking it occasionally, she has been taking the 5 mg regularly with blood pressure 136/103, 128/98 on recheck. Discussed medication options for the patient.  She has had a tubal ligation in the past and so we have a lot of options for medications.  She endorsed that when she was taking the 10 mg amlodipine she was not taking it regularly though she has been taking the 5 mg regularly since restarting it.  Patient ultimately decides to try the 10 mg and follow-up in 1 week to see how her blood pressure is doing.  If it is still elevated at that time we can try additional medications.

## 2021-03-09 ENCOUNTER — Other Ambulatory Visit (HOSPITAL_COMMUNITY): Payer: Self-pay

## 2021-04-12 ENCOUNTER — Ambulatory Visit: Payer: Medicaid Other

## 2021-09-25 ENCOUNTER — Encounter: Payer: Self-pay | Admitting: Family Medicine

## 2021-09-25 ENCOUNTER — Other Ambulatory Visit (HOSPITAL_COMMUNITY): Payer: Self-pay

## 2021-09-25 ENCOUNTER — Other Ambulatory Visit: Payer: Self-pay

## 2021-09-25 ENCOUNTER — Ambulatory Visit (INDEPENDENT_AMBULATORY_CARE_PROVIDER_SITE_OTHER): Payer: Medicaid Other | Admitting: Family Medicine

## 2021-09-25 VITALS — BP 149/102 | HR 80 | Wt 190.4 lb

## 2021-09-25 DIAGNOSIS — I10 Essential (primary) hypertension: Secondary | ICD-10-CM

## 2021-09-25 LAB — POCT UA - MICROSCOPIC ONLY
RBC, Urine, Miroscopic: NONE SEEN (ref 0–2)
WBC, Ur, HPF, POC: NONE SEEN (ref 0–5)

## 2021-09-25 LAB — POCT GLYCOSYLATED HEMOGLOBIN (HGB A1C): Hemoglobin A1C: 4.8 % (ref 4.0–5.6)

## 2021-09-25 MED ORDER — OLMESARTAN MEDOXOMIL 20 MG PO TABS
20.0000 mg | ORAL_TABLET | Freq: Every day | ORAL | 3 refills | Status: DC
Start: 2021-09-25 — End: 2021-10-04
  Filled 2021-09-25: qty 30, 30d supply, fill #0

## 2021-09-25 NOTE — Patient Instructions (Addendum)
It was wonderful to see you today. ? ?Please bring ALL of your medications with you to every visit.  ? ?Today we talked about: ? ?High blood pressure.  For this I have prescribed olmesartan 20 mg daily be taking with your amlodipine.  We will follow-up in 1 to 2 weeks for another blood pressure recheck.  In the meantime I will obtain the labs and when available I will notify you.  Please return for a lab visit in 24 hours or after collecting your 24-hour urine for blood draw. ? ?If you continue to have elevated blood pressures with headache and changes in your vision changes in your vision return to care sooner or go to ED. ? ?Call [facility] at [phone] to schedule your visit. ? ?Please be sure to schedule follow up at the front  desk before you leave today.  ? ?If you haven't already, sign up for My Chart to have easy access to your labs results, and communication with your primary care physician. ? ?Please call the clinic at [phone] if your symptoms worsen or you have any concerns. It was our pleasure to serve you. ? ?Dr. Janus Molder ? ?

## 2021-09-25 NOTE — Progress Notes (Signed)
? ? ?  SUBJECTIVE:  ? ?CHIEF COMPLAINT / HPI:  ? ?Hypertension ?- Medications: amlodipine 10 mg ?- Compliance: Yes ?- Checking BP at home: Yes, 170s/120s at home.  States that she was at the beach last [place] and felt her ears and face get hot.  She went to the fire department and had her blood pressure checked which was 215/195.  At this time she also had a headache. ?- Denies any SOB, CP, vision changes, LE edema, medication SEs, or symptoms of hypotension ?-Previous smoker quit 1 year ago ?- Family history of hypertension in her mother has high blood pressure ? ?PERTINENT  PMH / PSH: Colon cancer family history in mother and father was told that she needed an early colonoscopy because of this. ? ?OBJECTIVE:  ? ?BP (!) 149/102   Pulse 80   Wt 190 lb 6.4 oz (86.4 kg)   SpO2 98%   BMI 33.73 kg/m?   ?Physical Exam ?Vitals reviewed.  ?Constitutional:   ?   General: She is not in acute distress. ?   Appearance: She is not ill-appearing, toxic-appearing or diaphoretic.  ?Cardiovascular:  ?   Rate and Rhythm: Normal rate and regular rhythm.  ?   Heart sounds: Normal heart sounds.  ?Pulmonary:  ?   Effort: Pulmonary effort is normal.  ?   Breath sounds: Normal breath sounds.  ?Neurological:  ?   Mental Status: She is alert and oriented to person, place, and time.  ?Psychiatric:     ?   Mood and Affect: Mood normal.     ?   Behavior: Behavior normal.  ? ?ASSESSMENT/PLAN:  ? ?Hypertension, unspecified type ?Not well controlled.  Only taking amlodipine 10 mg.  Ambulatory blood pressure are significantly high.  History of facial flushing with high blood pressures.  Needs risk stratification work-up will obtain lipid panel and A1c as well as CMP.  We will consider aldosteronism and obtain aldosterone renin ratio.  Also consider pheochromocytoma with facial flushing and elevated blood pressures, will obtain 24-hour urine metanephrines and catecholamines.  We will also obtain UA to look for proteinuria.  Follow-up to be  determined when results available.  For now we will start olmesartan to help improve blood pressure.  We will need repeat blood pressure when she comes in for future morning labs.  ED precautions given. ?- Lipid Panel ?- HgB A1c ?- Comprehensive metabolic panel ?- POCT UA - Microscopic Only ?- Aldosterone + renin activity w/ ratio; Future ?- Metanephrines, urine, 24 hour; Future ?- Catecholamines, fractionated, urine, 24 hour; Future ?- olmesartan (BENICAR) 20 MG tablet; Take 1 tablet (20 mg total) by mouth at bedtime.  Dispense: 90 tablet; Refill: 3 ?- Metanephrines, urine, 24 hour ? ?Of note referral to GI for colonoscopy should still be valid, number given to patient to schedule colonoscopy. ? ?Gerlene Fee, DO ?[place]  ?

## 2021-09-26 LAB — COMPREHENSIVE METABOLIC PANEL
ALT: 46 IU/L — ABNORMAL HIGH (ref 0–32)
AST: 31 IU/L (ref 0–40)
Albumin/Globulin Ratio: 1.9 (ref 1.2–2.2)
Albumin: 4.7 g/dL (ref 3.8–4.8)
Alkaline Phosphatase: 71 IU/L (ref 44–121)
BUN/Creatinine Ratio: 6 — ABNORMAL LOW (ref 9–23)
BUN: 5 mg/dL — ABNORMAL LOW (ref 6–20)
Bilirubin Total: 0.3 mg/dL (ref 0.0–1.2)
CO2: 27 mmol/L (ref 20–29)
Calcium: 10.2 mg/dL (ref 8.7–10.2)
Chloride: 98 mmol/L (ref 96–106)
Creatinine, Ser: 0.78 mg/dL (ref 0.57–1.00)
Globulin, Total: 2.5 g/dL (ref 1.5–4.5)
Glucose: 79 mg/dL (ref 70–99)
Potassium: 3.9 mmol/L (ref 3.5–5.2)
Sodium: 140 mmol/L (ref 134–144)
Total Protein: 7.2 g/dL (ref 6.0–8.5)
eGFR: 103 mL/min/{1.73_m2} (ref >59)

## 2021-09-26 LAB — LIPID PANEL
Chol/HDL Ratio: 5.5 ratio — ABNORMAL HIGH (ref 0.0–4.4)
Cholesterol, Total: 219 mg/dL — ABNORMAL HIGH (ref 100–199)
HDL: 40 mg/dL (ref >39)
LDL Chol Calc (NIH): 146 mg/dL — ABNORMAL HIGH (ref 0–99)
Triglycerides: 180 mg/dL — ABNORMAL HIGH (ref 0–149)
VLDL Cholesterol Cal: 33 mg/dL (ref 5–40)

## 2021-09-27 ENCOUNTER — Other Ambulatory Visit: Payer: Medicaid Other

## 2021-09-27 DIAGNOSIS — I10 Essential (primary) hypertension: Secondary | ICD-10-CM

## 2021-09-28 ENCOUNTER — Telehealth: Payer: Self-pay | Admitting: Family Medicine

## 2021-09-28 NOTE — Telephone Encounter (Signed)
Patient returns call to nurse line.  ? ?Patient advised of cholesterol results and need for lifestyle changes.  ?

## 2021-09-28 NOTE — Telephone Encounter (Signed)
Called patient.  No answer.  Generic answering machine.  Left HIPAA compliant voicemail to call clinic back to discuss results. ? ?Patient is results as indicated for high cholesterol.  The goal will be to decrease her LDL to also help decrease her triglycerides.  This can be done with lifestyle changes such as decreasing processed, fried, high in saturated fats foods.  ? ?Would repeat cholesterol panel in 1 year.   ? ?If needed I can discuss with patient further if she calls back. ? ?Gerlene Fee, DO ?09/28/2021, 11:11 AM ?PGY-3, [place] ? ?

## 2021-10-01 LAB — CATECHOLAMINES, FRACTIONATED, URINE, 24 HOUR
Dopamine , 24H Ur: 231 ug/24 hr (ref 0–510)
Dopamine, Rand Ur: 178 ug/L
Epinephrine, 24H Ur: 3 ug/24 hr (ref 0–20)
Epinephrine, Rand Ur: 2 ug/L
Norepinephrine, 24H Ur: 59 ug/24 hr (ref 0–135)
Norepinephrine, Rand Ur: 45 ug/L

## 2021-10-03 LAB — METANEPHRINES, URINE, 24 HOUR
Metaneph Total, Ur: 64 ug/L
Metanephrines, 24H Ur: 83 ug/24 hr (ref 36–209)
Normetanephrine, 24H Ur: 417 ug/24 hr (ref 131–612)
Normetanephrine, Ur: 321 ug/L

## 2021-10-03 NOTE — Patient Instructions (Addendum)
Your blood pressure is at goal!  Good job!  Continue your current medications. ?If you would find out from your job if they are going to set up the hearing screen if not please call us and schedule. ? ?Dr. Janus Molder ? ?

## 2021-10-03 NOTE — Progress Notes (Signed)
? ? ?  SUBJECTIVE:  ? ?CHIEF COMPLAINT / HPI:  ? ?Hypertension ?- Medications: amlodipine 10 mg, olmesartan 20 mg qhs ?- Compliance: Yes ?- Checking BP at home: Yes ?- Denies any SOB, CP, vision changes, LE edema, medication SEs, or symptoms of hypotension ?- Previous smoker quit 1 year ago ?- Family history of hypertension in her mother has high blood pressure ? ?PERTINENT  PMH / PSH: Starting job as a Designer, industrial/product.  ? ?OBJECTIVE:  ? ?BP 130/80   Pulse (!) 106   Wt 190 lb 3.2 oz (86.3 kg)   SpO2 95%   BMI 33.69 kg/m?   ?General: Appears well, no acute distress. Age appropriate. ?Cardiac: RRR, normal heart sounds, no murmurs ?Respiratory: CTAB, normal effort ?Neuro: alert and oriented ?Psych: normal affect ? ?ASSESSMENT/PLAN:  ? ?Hypertension, unspecified type ?At goal. Improved with current medication. Reviewed 24 hr urine metanephrines and catecholamines which were wnl. Will follow up aldo/renin ratio. Needs BMP at follow up. ?- olmesartan (BENICAR) 20 MG tablet; Take 1 tablet (20 mg total) by mouth at bedtime.  Dispense: 90 tablet; Refill: 0 ?- amLODipine (NORVASC) 10 MG tablet; Take 1 tablet (10 mg total) by mouth daily.  Dispense: 90 tablet; Refill: 0 ? ?Dorothy Malphrus Autry-Lott, DO ?[place]  ?

## 2021-10-04 ENCOUNTER — Encounter: Payer: Self-pay | Admitting: Family Medicine

## 2021-10-04 ENCOUNTER — Ambulatory Visit (INDEPENDENT_AMBULATORY_CARE_PROVIDER_SITE_OTHER): Payer: Medicaid Other | Admitting: Family Medicine

## 2021-10-04 VITALS — BP 130/80 | HR 106 | Ht 63.0 in | Wt 190.2 lb

## 2021-10-04 DIAGNOSIS — I10 Essential (primary) hypertension: Secondary | ICD-10-CM

## 2021-10-04 MED ORDER — OLMESARTAN MEDOXOMIL 20 MG PO TABS: 20.0000 mg | ORAL_TABLET | Freq: Every day | ORAL | 0 refills | Status: DC

## 2021-10-04 MED ORDER — AMLODIPINE BESYLATE 10 MG PO TABS: 10.0000 mg | ORAL_TABLET | Freq: Every day | ORAL | 0 refills | Status: DC

## 2021-10-06 LAB — ALDOSTERONE + RENIN ACTIVITY W/ RATIO
ALDOS/RENIN RATIO: 1.1 (ref 0.0–30.0)
ALDOSTERONE: 5.2 ng/dL (ref 0.0–30.0)
Renin: 4.777 ng/mL/hr (ref 0.167–5.380)

## 2021-10-30 ENCOUNTER — Encounter: Payer: Self-pay | Admitting: *Deleted

## 2022-01-17 ENCOUNTER — Other Ambulatory Visit: Payer: Self-pay | Admitting: Family Medicine

## 2022-01-17 DIAGNOSIS — I10 Essential (primary) hypertension: Secondary | ICD-10-CM

## 2022-02-11 ENCOUNTER — Ambulatory Visit: Payer: Medicaid Other | Admitting: Family Medicine

## 2022-02-11 ENCOUNTER — Telehealth: Payer: Self-pay

## 2022-02-11 NOTE — Telephone Encounter (Signed)
Patient walks into [facility] this morning complaining of vision changes and nausea.  Patient was added to provider schedule and  was in the process of being checked in by front office staff, however patient abruptly left stating she was going somewhere else.

## 2022-03-29 ENCOUNTER — Other Ambulatory Visit (HOSPITAL_COMMUNITY)
Admission: RE | Admit: 2022-03-29 | Discharge: 2022-03-29 | Disposition: A | Discharge status: Home / Self Care | Payer: Medicaid Other | Source: Ambulatory Visit | Attending: Family Medicine | Admitting: Family Medicine

## 2022-03-29 ENCOUNTER — Other Ambulatory Visit (HOSPITAL_COMMUNITY): Payer: Self-pay

## 2022-03-29 ENCOUNTER — Ambulatory Visit (INDEPENDENT_AMBULATORY_CARE_PROVIDER_SITE_OTHER): Payer: Medicaid Other | Admitting: Student

## 2022-03-29 VITALS — BP 123/79 | HR 76 | Ht 63.0 in | Wt 192.0 lb

## 2022-03-29 DIAGNOSIS — N760 Acute vaginitis: Secondary | ICD-10-CM | POA: Diagnosis not present

## 2022-03-29 DIAGNOSIS — Z1211 Encounter for screening for malignant neoplasm of colon: Secondary | ICD-10-CM | POA: Diagnosis not present

## 2022-03-29 DIAGNOSIS — B9689 Other specified bacterial agents as the cause of diseases classified elsewhere: Secondary | ICD-10-CM

## 2022-03-29 DIAGNOSIS — N898 Other specified noninflammatory disorders of vagina: Secondary | ICD-10-CM | POA: Insufficient documentation

## 2022-03-29 LAB — POCT WET PREP (WET MOUNT)
Clue Cells Wet Prep Whiff POC: POSITIVE
Trichomonas Wet Prep HPF POC: ABSENT

## 2022-03-29 MED ORDER — METRONIDAZOLE 500 MG PO TABS
500.0000 mg | ORAL_TABLET | Freq: Two times a day (BID) | ORAL | 0 refills | Status: AC
  Filled 2022-03-29: qty 14, 7d supply, fill #0

## 2022-03-29 NOTE — Progress Notes (Signed)
    SUBJECTIVE:   CHIEF COMPLAINT / HPI:   Vaginal Discharge: Patient is a 31 y.o. female presenting with vaginal odor for 2 months similar to when she previously had BV. She is interested in screening for sexually transmitted infections today-has not had intercourse in 5 months. She has contraception with  Tubal Ligation. She is also due for pap smear today  PERTINENT  PMH / PSH: None relevant  OBJECTIVE:   BP 123/79   Pulse 76   Ht '5\' 3"'$  (1.6 m)   Wt 192 lb (87.1 kg)   LMP 03/15/2022   SpO2 100%   BMI 34.01 kg/m    General: NAD, pleasant, able to participate in exam Respiratory: Normal effort, no obvious respiratory distress Pelvic: VULVA: normal appearing vulva with no masses, tenderness or lesions, VAGINA: Normal appearing vagina with normal color, no lesions, with white/grey discharge present, CERVIX: No lesions, white discharge present  Chaperone Glori Bickers CMA present for pelvic exam  ASSESSMENT/PLAN:   No problem-specific Assessment & Plan notes found for this encounter.   Assessment:  31 y.o. female with vaginal odor for 2 months. Physical exam significant for white/gray discharge.  Wet prep performed today shows clue cells, positive whiff test consistent with BV.  Patient is interested in STI screening.   Plan: -Wet prep as above.  Will treat with metronidazole. -Discussed protection during intercourse -Pap with HPV testing collected -Follow-up as needed  Gerrit Heck, MD [place]

## 2022-03-29 NOTE — Patient Instructions (Signed)
It was great to see you! Thank you for allowing me to participate in your care!   I recommend that you always bring your medications to each appointment as this makes it easy to ensure we are on the correct medications and helps [place] not miss when refills are needed.  Our plans for today:  - I will let you know what your pap smear and swabs show!  We are checking some labs today, I will call you if they are abnormal will send you a MyChart message or a letter if they are normal.  If you do not hear about your labs in the next 2 weeks please let us know.  Take care and seek immediate care sooner if you develop any concerns. Please remember to show up 15 minutes before your scheduled appointment time!  Gerrit Heck, MD [place]

## 2022-03-29 NOTE — Addendum Note (Signed)
Addended by: Gerrit Heck on: 03/29/2022 12:13 PM   Modules accepted: Orders

## 2022-03-30 LAB — HIV ANTIBODY (ROUTINE TESTING W REFLEX): HIV Screen 4th Generation wRfx: NONREACTIVE

## 2022-03-30 LAB — RPR: RPR Ser Ql: NONREACTIVE

## 2022-04-01 LAB — CERVICOVAGINAL ANCILLARY ONLY
Chlamydia: NEGATIVE
Comment: NEGATIVE
Comment: NORMAL
Neisseria Gonorrhea: NEGATIVE

## 2022-04-02 LAB — CYTOLOGY - PAP
Adequacy: ABSENT
Comment: NEGATIVE
Diagnosis: NEGATIVE
High risk HPV: NEGATIVE

## 2022-05-17 ENCOUNTER — Other Ambulatory Visit: Payer: Self-pay | Admitting: Student

## 2022-05-17 DIAGNOSIS — I10 Essential (primary) hypertension: Secondary | ICD-10-CM

## 2022-05-29 ENCOUNTER — Encounter: Payer: Self-pay | Admitting: Gastroenterology

## 2022-05-31 ENCOUNTER — Ambulatory Visit: Payer: Medicaid Other | Admitting: Gastroenterology

## 2022-06-11 ENCOUNTER — Other Ambulatory Visit (HOSPITAL_COMMUNITY): Payer: Self-pay

## 2022-06-11 MED ORDER — METHYLPREDNISOLONE 4 MG PO TBPK
ORAL_TABLET | ORAL | 0 refills | Status: DC
  Filled 2022-06-11: qty 21, 6d supply, fill #0

## 2022-06-11 MED ORDER — HYDROCODONE-ACETAMINOPHEN 7.5-325 MG PO TABS
1.0000 | ORAL_TABLET | Freq: Four times a day (QID) | ORAL | 0 refills | Status: DC | PRN
  Filled 2022-06-11: qty 15, 4d supply, fill #0

## 2022-06-18 ENCOUNTER — Other Ambulatory Visit: Payer: Self-pay | Admitting: Student

## 2022-06-18 DIAGNOSIS — I10 Essential (primary) hypertension: Secondary | ICD-10-CM

## 2022-07-05 ENCOUNTER — Ambulatory Visit (INDEPENDENT_AMBULATORY_CARE_PROVIDER_SITE_OTHER): Payer: Medicaid Other | Admitting: Gastroenterology

## 2022-07-05 ENCOUNTER — Encounter: Payer: Self-pay | Admitting: Gastroenterology

## 2022-07-05 VITALS — BP 124/72 | HR 62 | Ht 63.0 in | Wt 185.0 lb

## 2022-07-05 DIAGNOSIS — Z800 Family history of malignant neoplasm of digestive organs: Secondary | ICD-10-CM

## 2022-07-05 NOTE — Progress Notes (Signed)
Chief Complaint: For colonoscopy  Referring Provider:  Gerrit Heck, MD      ASSESSMENT AND PLAN;   #1.  Jacqulyn Liner FH CRC- F died at age 34 (stage IV), M - mid 56s   #2. IBS-D.  Plan: -Colon    Discussed risks & benefits of colonoscopy. Risks including rare perforation req laparotomy, bleeding after bx/polypectomy req blood transfusion, rarely missing neoplasms, risks of anesthesia/sedation, rare risk of damage to internal organs. Benefits outweigh the risks. Patient agrees to proceed. All the questions were answered. Pt consents to proceed. HPI:    Dorothy Schwartz is a 33 y.o. female  Very pleasant patient With H/O HTN, mild anxiety  Here for colonoscopy d/t strong FH CRC- dad with stage IV (Dx at 63, unfortunately passed away same year), mom in mid 74s- s/p colon resection.  Doing well.  Getting frequent colonoscopies.  Sister at age 15 had polyps.  No suggestion of genetic test  Occ diarrhea x yrs After eating specially salads and outside With abdominal bloating, lower abdominal discomfort which gets better No nocturnal symptoms Likely IBS-D.  Rare constipation No melena or hematochezia  No weight loss.  Denies having any upper GI symptoms including nausea, vomiting, heartburn, regurgitation, odynophagia or dysphagia.     FH- F died at age 5 (stage IV), mom - mid 61s  Sis polyps 65s   SH: 2 boys 12/9, adopted 78-monthold daughter.  Works as a cDesigner, industrial/product Past Medical History:  Diagnosis Date   Anemia 2011   postop anemia after CS   Headache(784.0)    Hypertension    Kidney stones    Nephrolithiasis 03/25/2017   Palpitations    per pt due to stress    Past Surgical History:  Procedure Laterality Date   CESAREAN SECTION     CESAREAN SECTION WITH BILATERAL TUBAL LIGATION Bilateral 08/13/2012   Procedure: CESAREAN SECTION WITH BILATERAL TUBAL LIGATION;  Surgeon: RMargarette Asal MD;  [facility];  Service: Obstetrics;  Laterality:  Bilateral;  Repeat edc 08/09/12   ORIF ANKLE FRACTURE Left 07/19/2018   Procedure: OPEN REDUCTION INTERNAL FIXATION (ORIF) ANKLE FRACTURE;  Surgeon: PCorky Mull MD;  Location: [facility];  Service: Orthopedics;  Laterality: Left;   WISDOM TOOTH EXTRACTION      Family History  Problem Relation Age of Onset   Hypertension Mother    Mood Disorder Mother    Lung cancer Mother        tobacco use   Colon cancer Mother 549  Colon cancer Father 546  Colon polyps Sister    Lung cancer Maternal Grandmother    Other Neg Hx     Social History   Tobacco Use   Smoking status: Every Day    Packs/day: 1.00    Types: Cigarettes    Start date: 2004   Smokeless tobacco: Never   Tobacco comments:    Quit with both pregnancy  Substance Use Topics   Alcohol use: Yes   Drug use: Yes    Types: Benzodiazepines    Current Outpatient Medications  Medication Sig Dispense Refill   ALPRAZolam (XANAX) 1 MG tablet Take 1 mg by mouth at bedtime as needed for anxiety.     amLODipine (NORVASC) 10 MG tablet TAKE 1 TABLET BY MOUTH EVERY DAY 90 tablet 0   olmesartan (BENICAR) 20 MG tablet TAKE 1 TABLET BY MOUTH EVERYDAY AT BEDTIME 90 tablet 1   HYDROcodone-acetaminophen (NORCO) 7.5-325 MG tablet Take 1 tablet  by mouth every 6 (six) hours as needed after dental extraction. (Patient not taking: Reported on 07/05/2022) 15 tablet 0   methylPREDNISolone (MEDROL DOSEPAK) 4 MG TBPK tablet Take as directed on package (Patient not taking: Reported on 07/05/2022) 21 tablet 0   No current facility-administered medications for this visit.    Allergies  Allergen Reactions   Morphine And Related Shortness Of Breath    Pt. States she can tolerate Dilaudid & has had it before.   Percocet [Oxycodone-Acetaminophen] Hives and Rash    Denies shortness of breath    Review of Systems:  Constitutional: Denies fever, chills, diaphoresis, appetite change and fatigue.  HEENT: Denies photophobia, eye pain, redness, hearing loss,  ear pain, congestion, sore throat, rhinorrhea, sneezing, mouth sores, neck pain, neck stiffness and tinnitus.   Respiratory: Denies SOB, DOE, cough, chest tightness,  and wheezing.   Cardiovascular: Denies chest pain, palpitations and leg swelling.  Genitourinary: Denies dysuria, urgency, frequency, hematuria, flank pain and difficulty urinating.  Musculoskeletal: Denies myalgias, back pain, joint swelling, arthralgias and gait problem.  Skin: No rash.  Neurological: Denies dizziness, seizures, syncope, weakness, light-headedness, numbness and headaches.  Hematological: Denies adenopathy. Easy bruising, personal or family bleeding history  Psychiatric/Behavioral: Has anxiety, no depression     Physical Exam:    BP 124/72   Pulse 62   Ht 5' 3"$  (1.6 m)   Wt 185 lb (83.9 kg)   BMI 32.77 kg/m  Wt Readings from Last 3 Encounters:  07/05/22 185 lb (83.9 kg)  03/29/22 192 lb (87.1 kg)  10/04/21 190 lb 3.2 oz (86.3 kg)   Constitutional:  Well-developed, in no acute distress. Psychiatric: Normal mood and affect. Behavior is normal. HEENT: Pupils normal.  Conjunctivae are normal. No scleral icterus. Cardiovascular: Normal rate, regular rhythm. No edema Pulmonary/chest: Effort normal and breath sounds normal. No wheezing, rales or rhonchi. Abdominal: Soft, nondistended. Nontender. Bowel sounds active throughout. There are no masses palpable. No hepatomegaly. Rectal: Deferred.  To be performed at the time of colonoscopy. Neurological: Alert and oriented to person place and time. Skin: Skin is warm and dry. No rashes noted.  Data Reviewed: I have personally reviewed following labs and imaging studies  CBC:    Latest Ref Rng & Units 03/02/2021   12:30 PM 08/20/2019    5:09 PM 10/30/2018    3:23 PM  CBC  WBC 4.0 - 10.5 K/uL 10.1  8.3    Hemoglobin 12.0 - 15.0 g/dL 14.7  12.6  14.1   Hematocrit 36.0 - 46.0 % 42.0  36.2    Platelets 150 - 400 K/uL 352  282       Carmell Austria, MD 07/05/2022,  10:21 AM  Cc: Gerrit Heck, MD

## 2022-07-05 NOTE — Patient Instructions (Signed)
_______________________________________________________  If your blood pressure at your visit was 140/90 or greater, please contact your primary care physician to follow up on this.  _______________________________________________________  If you are age 34 or older, your body mass index should be between 23-30. Your Body mass index is 32.77 kg/m. If this is out of the aforementioned range listed, please consider follow up with your Primary Care Provider.  If you are age 7 or younger, your body mass index should be between 19-25. Your Body mass index is 32.77 kg/m. If this is out of the aformentioned range listed, please consider follow up with your Primary Care Provider.   ________________________________________________________  The [facility] providers would like to encourage you to use [facility] to communicate with providers for non-urgent requests or questions.  Due to long hold times on the telephone, sending your provider a message by [facility] may be a faster and more efficient way to get a response.  Please allow 48 business hours for a response.  Please remember that this is for non-urgent requests.  _______________________________________________________  Dennis Bast have been scheduled for a colonoscopy. Please follow written instructions given to you at your visit today.  Please pick up your prep supplies at the pharmacy within the next 1-3 days. If you use inhalers (even only as needed), please bring them with you on the day of your procedure.  Please call with any questions or concerns.  Thank you,  Dr. Jackquline Denmark

## 2022-07-09 ENCOUNTER — Institutional Professional Consult (permissible substitution): Payer: Medicaid Other | Admitting: Plastic Surgery

## 2022-07-15 ENCOUNTER — Ambulatory Visit (INDEPENDENT_AMBULATORY_CARE_PROVIDER_SITE_OTHER): Payer: Medicaid Other | Admitting: Plastic Surgery

## 2022-07-15 ENCOUNTER — Encounter: Payer: Self-pay | Admitting: Plastic Surgery

## 2022-07-15 VITALS — BP 122/78 | HR 65 | Ht 63.0 in | Wt 187.4 lb

## 2022-07-15 DIAGNOSIS — L819 Disorder of pigmentation, unspecified: Secondary | ICD-10-CM | POA: Diagnosis not present

## 2022-07-15 NOTE — Progress Notes (Signed)
Patient ID: Dorothy Schwartz, female    DOB: 02-27-1991, 32 y.o.   MRN: [id number]   Chief Complaint  Patient presents with   Consult        Skin Problem    The patient is a 32 yrs old female here for evaluation of her lower left eyelid.  She has noticed a pigmented lesion on the lid for many years.  Over the past few years it has gotten larger.  It is ~ 5 mm in size and slightly irregular.  The concerning aspect is that the surrounding tissue is slightly fuller than the other side.  She is otherwise in good health and not had any other issues.  No history of trauma or skin cancer.  Her past medical history is listed below.  She had her wisdom teeth extracted, a c-section and ankle surgery for a fracture.    Review of Systems  Constitutional: Negative.   HENT: Negative.    Eyes: Negative.   Respiratory: Negative.    Cardiovascular: Negative.   Gastrointestinal: Negative.   Endocrine: Negative.   Genitourinary: Negative.   Musculoskeletal: Negative.   Hematological: Negative.     Past Medical History:  Diagnosis Date   Anemia 2011   postop anemia after CS   Headache(784.0)    Hypertension    Kidney stones    Nephrolithiasis 03/25/2017   Palpitations    per pt due to stress    Past Surgical History:  Procedure Laterality Date   CESAREAN SECTION     CESAREAN SECTION WITH BILATERAL TUBAL LIGATION Bilateral 08/13/2012   Procedure: CESAREAN SECTION WITH BILATERAL TUBAL LIGATION;  Surgeon: Margarette Asal, MD;  Location: [facility];  Service: Obstetrics;  Laterality: Bilateral;  Repeat edc 08/09/12   ORIF ANKLE FRACTURE Left 07/19/2018   Procedure: OPEN REDUCTION INTERNAL FIXATION (ORIF) ANKLE FRACTURE;  Surgeon: Corky Mull, MD;  Location: [facility];  Service: Orthopedics;  Laterality: Left;   WISDOM TOOTH EXTRACTION        Current Outpatient Medications:    ALPRAZolam (XANAX) 1 MG tablet, Take 1 mg by mouth at bedtime as needed for anxiety., Disp: , Rfl:    amLODipine  (NORVASC) 10 MG tablet, TAKE 1 TABLET BY MOUTH EVERY DAY, Disp: 90 tablet, Rfl: 0   olmesartan (BENICAR) 20 MG tablet, TAKE 1 TABLET BY MOUTH EVERYDAY AT BEDTIME, Disp: 90 tablet, Rfl: 1   HYDROcodone-acetaminophen (NORCO) 7.5-325 MG tablet, Take 1 tablet by mouth every 6 (six) hours as needed after dental extraction., Disp: 15 tablet, Rfl: 0   methylPREDNISolone (MEDROL DOSEPAK) 4 MG TBPK tablet, Take as directed on package, Disp: 21 tablet, Rfl: 0   Objective:   Vitals:   07/15/22 1351  BP: 122/78  Pulse: 65  SpO2: 96%    Physical Exam Constitutional:      Appearance: Normal appearance.  HENT:     Head: Normocephalic.  Cardiovascular:     Rate and Rhythm: Normal rate.  Pulmonary:     Effort: Pulmonary effort is normal.  Musculoskeletal:        General: No swelling or deformity.  Skin:    General: Skin is warm.     Capillary Refill: Capillary refill takes less than 2 seconds.     Coloration: Skin is not jaundiced.     Findings: No bruising.  Neurological:     Mental Status: She is alert and oriented to person, place, and time.  Psychiatric:  Mood and Affect: Mood normal.        Behavior: Behavior normal.        Thought Content: Thought content normal.        Judgment: Judgment normal.     Assessment & Plan:  Changing pigmented skin lesion  Recommend staged excision due to size and location.  Biopsy and then full excision once we know if it has any dysplasia.    Pictures were obtained of the patient and placed in the chart with the patient's or guardian's permission.   [place], DO

## 2022-08-20 ENCOUNTER — Encounter: Payer: Self-pay | Admitting: Student

## 2022-08-20 ENCOUNTER — Ambulatory Visit (INDEPENDENT_AMBULATORY_CARE_PROVIDER_SITE_OTHER): Payer: Medicaid Other | Admitting: Student

## 2022-08-20 VITALS — BP 114/63 | HR 77 | Temp 98.1°F | Ht 63.0 in | Wt 193.2 lb

## 2022-08-20 DIAGNOSIS — J069 Acute upper respiratory infection, unspecified: Secondary | ICD-10-CM | POA: Diagnosis present

## 2022-08-20 MED ORDER — FLUTICASONE PROPIONATE 50 MCG/ACT NA SUSP: 2.0000 | Freq: Every day | NASAL | 6 refills | Status: DC

## 2022-08-20 NOTE — Patient Instructions (Signed)
It was great to see you! Thank you for allowing me to participate in your care!   Our plans for today:  - It looks like you have a viral infection- I recommend wearing a mask on Friday when going to work - I will send in flonase for your nasal congestions -Hydrate frequently with Pedialyte/water -You may alternate ibuprofen and Tylenol every 3 hours as needed for body aches/sore throat  Take care and seek immediate care sooner if you develop any concerns.  Gerrit Heck, MD

## 2022-08-20 NOTE — Progress Notes (Unsigned)
    SUBJECTIVE:   CHIEF COMPLAINT / HPI: Fever + Sore Throat  Has been going on for 2 days.  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  ***  ASSESSMENT/PLAN:   No problem-specific Assessment & Plan notes found for this encounter.     Gerrit Heck, MD [place]

## 2022-08-26 ENCOUNTER — Telehealth: Payer: Self-pay | Admitting: Gastroenterology

## 2022-08-26 NOTE — Telephone Encounter (Signed)
New prep instructions were sent to pt via my Chart. Pt made aware. Pt verbalized understanding with all questions answered.

## 2022-08-26 NOTE — Telephone Encounter (Signed)
Patient is calling to reschedule procedure 4/3 states she had lost her prep instructions and had not been following the diet. Patient rescheduled for 4/10 at 9:00 am, will need new prep instructions.

## 2022-08-28 ENCOUNTER — Encounter: Payer: Medicaid Other | Admitting: Gastroenterology

## 2022-09-03 ENCOUNTER — Other Ambulatory Visit: Payer: Self-pay | Admitting: Student

## 2022-09-03 DIAGNOSIS — I10 Essential (primary) hypertension: Secondary | ICD-10-CM

## 2022-09-04 ENCOUNTER — Encounter: Payer: Self-pay | Admitting: Gastroenterology

## 2022-09-04 ENCOUNTER — Ambulatory Visit (AMBULATORY_SURGERY_CENTER): Payer: Medicaid Other | Admitting: Gastroenterology

## 2022-09-04 VITALS — BP 106/67 | HR 56 | Temp 98.0°F | Resp 15 | Ht 63.0 in | Wt 185.0 lb

## 2022-09-04 DIAGNOSIS — Z800 Family history of malignant neoplasm of digestive organs: Secondary | ICD-10-CM | POA: Diagnosis not present

## 2022-09-04 DIAGNOSIS — D125 Benign neoplasm of sigmoid colon: Secondary | ICD-10-CM

## 2022-09-04 DIAGNOSIS — Z1211 Encounter for screening for malignant neoplasm of colon: Secondary | ICD-10-CM

## 2022-09-04 MED ORDER — SODIUM CHLORIDE 0.9 % IV SOLN
500.0000 mL | Freq: Once | INTRAVENOUS | Status: DC
Start: 2022-09-04 — End: 2022-09-04

## 2022-09-04 NOTE — Op Note (Signed)
[facility] Patient Name: Dorothy Schwartz Procedure Date: 09/04/2022 9:12 AM MRN: [id number] Endoscopist: Lynann Bologna , MD, [id number] Age: 32 Referring MD:  Date of Birth: [birth year] Gender: Female Account #: [id number] Procedure:                Colonoscopy Indications:              1. Strong FH CRC- F died at age 44 (stage IV), M -                            mid 100s 2. Intermittent diarrhea Medicines:                Monitored Anesthesia Care Procedure:                Pre-Anesthesia Assessment:                           - Prior to the procedure, a History and Physical                            was performed, and patient medications and                            allergies were reviewed. The patient's tolerance of                            previous anesthesia was also reviewed. The risks                            and benefits of the procedure and the sedation                            options and risks were discussed with the patient.                            All questions were answered, and informed consent                            was obtained. Prior Anticoagulants: The patient has                            taken no anticoagulant or antiplatelet agents. ASA                            Grade Assessment: II - A patient with mild systemic                            disease. After reviewing the risks and benefits,                            the patient was deemed in satisfactory condition to                            undergo the procedure.  After obtaining informed consent, the colonoscope                            was passed under direct vision. Throughout the                            procedure, the patient's blood pressure, pulse, and                            oxygen saturations were monitored continuously. The                            PCF-HQ190L Colonoscope [id number] was introduced                            through the anus and advanced  to the 2 cm into the                            ileum. The colonoscopy was performed without                            difficulty. The patient tolerated the procedure                            well. The quality of the bowel preparation was                            good. The terminal ileum, ileocecal valve,                            appendiceal orifice, and rectum were photographed. Scope In: 9:18:19 AM Scope Out: 9:35:39 AM Scope Withdrawal Time: 0 hours 14 minutes 3 seconds  Total Procedure Duration: 0 hours 17 minutes 20 seconds  Findings:                 A 10 mm polyp was found in the mid sigmoid colon.                            The polyp was semi-sessile. The polyp was removed                            with a cold snare. Resection and retrieval were                            complete.                           The colon (entire examined portion) appeared                            normal. Biopsies for histology were taken with a                            cold forceps from the entire colon for  evaluation                            of microscopic colitis.                           Non-bleeding internal hemorrhoids were found during                            retroflexion. The hemorrhoids were small and Grade                            I (internal hemorrhoids that do not prolapse).                           The terminal ileum appeared normal. Biopsies were                            taken with a cold forceps for histology.                           The exam was otherwise normal throughout the                            examined colon. Complications:            No immediate complications. Estimated Blood Loss:     Estimated blood loss: none. Impression:               - One 10 mm polyp in the mid sigmoid colon, removed                            with a cold snare. Resected and retrieved.                           - Otherwise normal colonoscopy to TI. Recommendation:           -  Patient has a contact number available for                            emergencies. The signs and symptoms of potential                            delayed complications were discussed with the                            patient. Return to normal activities tomorrow.                            Written discharge instructions were provided to the                            patient.                           - Resume previous diet.                           -  Continue present medications.                           - Await pathology results.                           - Repeat colonoscopy for surveillance based on                            pathology results.                           - The findings and recommendations were discussed                            with the patient's family.                           - Patient has a contact number available for                            emergencies. The signs and symptoms of potential                            delayed complications were discussed with the                            patient. Return to normal activities tomorrow.                            Written discharge instructions were provided to the                            patient.                           - No aspirin, ibuprofen, naproxen, or other                            non-steroidal anti-inflammatory drugs for 5 days                            after polyp removal. Lynann Bologna, MD 09/04/2022 9:41:18 AM This report has been signed electronically.

## 2022-09-04 NOTE — Progress Notes (Signed)
Pt's states no medical or surgical changes since previsit or office visit. 

## 2022-09-04 NOTE — Progress Notes (Signed)
Called to room to assist during endoscopic procedure.  Patient ID and intended procedure confirmed with present staff. Received instructions for my participation in the procedure from the performing physician.  

## 2022-09-04 NOTE — Progress Notes (Signed)
Vss nad trans to pacu 

## 2022-09-04 NOTE — Progress Notes (Signed)
Chief Complaint: For colonoscopy  Referring Provider:  Levin Erp, MD      ASSESSMENT AND PLAN;   #1.  Barrington Ellison FH CRC- F died at age 35 (stage IV), M - mid 56s   #2. IBS-D.  Plan: -Colon    Discussed risks & benefits of colonoscopy. Risks including rare perforation req laparotomy, bleeding after bx/polypectomy req blood transfusion, rarely missing neoplasms, risks of anesthesia/sedation, rare risk of damage to internal organs. Benefits outweigh the risks. Patient agrees to proceed. All the questions were answered. Pt consents to proceed. HPI:    Dorothy Schwartz is a 32 y.o. female  Very pleasant patient With H/O HTN, mild anxiety  Here for colonoscopy d/t strong FH CRC- dad with stage IV (Dx at 74, unfortunately passed away same year), mom in mid 63s- s/p colon resection.  Doing well.  Getting frequent colonoscopies.  Sister at age 65 had polyps.  No suggestion of genetic test  Occ diarrhea x yrs After eating specially salads and outside With abdominal bloating, lower abdominal discomfort which gets better No nocturnal symptoms Likely IBS-D.  Rare constipation No melena or hematochezia  No weight loss.  Denies having any upper GI symptoms including nausea, vomiting, heartburn, regurgitation, odynophagia or dysphagia.     FH- F died at age 36 (stage IV), mom - mid 42s  Sis polyps 30s   SH: 2 boys 12/9, adopted 15-month-old daughter.  Works as a Public relations account executive. Past Medical History:  Diagnosis Date   Anemia 2011   postop anemia after CS   Headache(784.0)    Hypertension    Kidney stones    Nephrolithiasis 03/25/2017   Palpitations    per pt due to stress    Past Surgical History:  Procedure Laterality Date   CESAREAN SECTION     CESAREAN SECTION WITH BILATERAL TUBAL LIGATION Bilateral 08/13/2012   Procedure: CESAREAN SECTION WITH BILATERAL TUBAL LIGATION;  Surgeon: Meriel Pica, MD;  Location: [facility];  Service: Obstetrics;  Laterality:  Bilateral;  Repeat edc 08/09/12   ORIF ANKLE FRACTURE Left 07/19/2018   Procedure: OPEN REDUCTION INTERNAL FIXATION (ORIF) ANKLE FRACTURE;  Surgeon: Christena Flake, MD;  Location: [facility] OR[facility];  Service: Orthopedics;  Laterality: Left;   WISDOM TOOTH EXTRACTION      Family History  Problem Relation Age of Onset   Hypertension Mother    Mood Disorder Mother    Lung cancer Mother        tobacco use   Colon cancer Mother 53   Colon cancer Father 46   Colon polyps Sister    Lung cancer Maternal Grandmother    Other Neg Hx     Social History   Tobacco Use   Smoking status: Every Day    Packs/day: 1    Types: Cigarettes    Start date: 2004   Smokeless tobacco: Never   Tobacco comments:    Quit with both pregnancy  Substance Use Topics   Alcohol use: Yes   Drug use: Yes    Types: Benzodiazepines    Current Outpatient Medications  Medication Sig Dispense Refill   ALPRAZolam (XANAX) 1 MG tablet Take 1 mg by mouth at bedtime as needed for anxiety.     amLODipine (NORVASC) 10 MG tablet TAKE 1 TABLET BY MOUTH EVERY DAY 90 tablet 0   olmesartan (BENICAR) 20 MG tablet TAKE 1 TABLET BY MOUTH EVERYDAY AT BEDTIME 90 tablet 1   fluticasone (FLONASE) 50 MCG/ACT nasal spray Place 2  sprays into both nostrils daily. 16 g 6   HYDROcodone-acetaminophen (NORCO) 7.5-325 MG tablet Take 1 tablet by mouth every 6 (six) hours as needed after dental extraction. (Patient not taking: Reported on 09/04/2022) 15 tablet 0   methylPREDNISolone (MEDROL DOSEPAK) 4 MG TBPK tablet Take as directed on package (Patient not taking: Reported on 09/04/2022) 21 tablet 0   Current Facility-Administered Medications  Medication Dose Route Frequency Provider Last Rate Last Admin   0.9 %  sodium chloride infusion  500 mL Intravenous Once Lynann Bologna, MD        Allergies  Allergen Reactions   Morphine And Related Shortness Of Breath    Pt. States she can tolerate Dilaudid & has had it before.   Percocet  [Oxycodone-Acetaminophen] Hives and Rash    Denies shortness of breath    Review of Systems:  Constitutional: Denies fever, chills, diaphoresis, appetite change and fatigue.  HEENT: Denies photophobia, eye pain, redness, hearing loss, ear pain, congestion, sore throat, rhinorrhea, sneezing, mouth sores, neck pain, neck stiffness and tinnitus.   Respiratory: Denies SOB, DOE, cough, chest tightness,  and wheezing.   Cardiovascular: Denies chest pain, palpitations and leg swelling.  Genitourinary: Denies dysuria, urgency, frequency, hematuria, flank pain and difficulty urinating.  Musculoskeletal: Denies myalgias, back pain, joint swelling, arthralgias and gait problem.  Skin: No rash.  Neurological: Denies dizziness, seizures, syncope, weakness, light-headedness, numbness and headaches.  Hematological: Denies adenopathy. Easy bruising, personal or family bleeding history  Psychiatric/Behavioral: Has anxiety, no depression     Physical Exam:    BP 121/72   Pulse 68   Temp 98 F (36.7 C)   Ht 5\' 3"  (1.6 m)   Wt 185 lb (83.9 kg)   SpO2 100%   BMI 32.77 kg/m  Wt Readings from Last 3 Encounters:  09/04/22 185 lb (83.9 kg)  08/20/22 193 lb 3.2 oz (87.6 kg)  07/15/22 187 lb 6.4 oz (85 kg)   Constitutional:  Well-developed, in no acute distress. Psychiatric: Normal mood and affect. Behavior is normal. HEENT: Pupils normal.  Conjunctivae are normal. No scleral icterus. Cardiovascular: Normal rate, regular rhythm. No edema Pulmonary/chest: Effort normal and breath sounds normal. No wheezing, rales or rhonchi. Abdominal: Soft, nondistended. Nontender. Bowel sounds active throughout. There are no masses palpable. No hepatomegaly. Rectal: Deferred.  To be performed at the time of colonoscopy. Neurological: Alert and oriented to person place and time. Skin: Skin is warm and dry. No rashes noted.  Data Reviewed: I have personally reviewed following labs and imaging studies  CBC:     Latest Ref Rng & Units 03/02/2021   12:30 PM 08/20/2019    5:09 PM 10/30/2018    3:23 PM  CBC  WBC 4.0 - 10.5 K/uL 10.1  8.3    Hemoglobin 12.0 - 15.0 g/dL 45.6  25.6  38.9   Hematocrit 36.0 - 46.0 % 42.0  36.2    Platelets 150 - 400 K/uL 352  282       Edman Circle, MD 09/04/2022, 9:12 AM  Cc: Levin Erp, MD

## 2022-09-04 NOTE — Patient Instructions (Signed)
Please read handouts provided. Continue present medications. Await pathology results. No aspirin, ibuprofen, naproxen, or other non-steriodal anti-inflammatory drugs for 5 days.   YOU HAD AN ENDOSCOPIC PROCEDURE TODAY AT THE  ENDOSCOPY CENTER:   Refer to the procedure report that was given to you for any specific questions about what was found during the examination.  If the procedure report does not answer your questions, please call your gastroenterologist to clarify.  If you requested that your care partner not be given the details of your procedure findings, then the procedure report has been included in a sealed envelope for you to review at your convenience later.  YOU SHOULD EXPECT: Some feelings of bloating in the abdomen. Passage of more gas than usual.  Walking can help get rid of the air that was put into your GI tract during the procedure and reduce the bloating. If you had a lower endoscopy (such as a colonoscopy or flexible sigmoidoscopy) you may notice spotting of blood in your stool or on the toilet paper. If you underwent a bowel prep for your procedure, you may not have a normal bowel movement for a few days.  Please Note:  You might notice some irritation and congestion in your nose or some drainage.  This is from the oxygen used during your procedure.  There is no need for concern and it should clear up in a day or so.  SYMPTOMS TO REPORT IMMEDIATELY:  Following lower endoscopy (colonoscopy or flexible sigmoidoscopy):  Excessive amounts of blood in the stool  Significant tenderness or worsening of abdominal pains  Swelling of the abdomen that is new, acute  Fever of 100F or higher   For urgent or emergent issues, a gastroenterologist can be reached at any hour by calling [phone]. Do not use MyChart messaging for urgent concerns.    DIET:  We do recommend a small meal at first, but then you may proceed to your regular diet.  Drink plenty of fluids but you  should avoid alcoholic beverages for 24 hours.  ACTIVITY:  You should plan to take it easy for the rest of today and you should NOT DRIVE or use heavy machinery until tomorrow (because of the sedation medicines used during the test).    FOLLOW UP: Our staff will call the number listed on your records the next business day following your procedure.  We will call around 7:15- 8:00 am to check on you and address any questions or concerns that you may have regarding the information given to you following your procedure. If we do not reach you, we will leave a message.     If any biopsies were taken you will be contacted by phone or by letter within the next 1-3 weeks.  Please call us at [phone] if you have not heard about the biopsies in 3 weeks.    SIGNATURES/CONFIDENTIALITY: You and/or your care partner have signed paperwork which will be entered into your electronic medical record.  These signatures attest to the fact that that the information above on your After Visit Summary has been reviewed and is understood.  Full responsibility of the confidentiality of this discharge information lies with you and/or your care-partner. 

## 2022-09-05 ENCOUNTER — Telehealth: Payer: Self-pay

## 2022-09-05 NOTE — Telephone Encounter (Signed)
  Follow up Call-     09/04/2022    8:37 AM  Call back number  Post procedure Call Back phone  # [phone]  Permission to leave phone message Yes     Patient questions:  Do you have a fever, pain , or abdominal swelling? No. Pain Score  0 *  Have you tolerated food without any problems? Yes.    Have you been able to return to your normal activities? Yes.    Do you have any questions about your discharge instructions: Diet   No. Medications  No. Follow up visit  No.  Do you have questions or concerns about your Care? No.  Actions: * If pain score is 4 or above: No action needed, pain <4.

## 2022-09-12 ENCOUNTER — Encounter: Payer: Self-pay | Admitting: Gastroenterology

## 2022-10-18 ENCOUNTER — Ambulatory Visit: Payer: Medicaid Other | Admitting: Plastic Surgery

## 2022-10-25 ENCOUNTER — Other Ambulatory Visit (HOSPITAL_COMMUNITY)
Admission: RE | Admit: 2022-10-25 | Discharge: 2022-10-25 | Disposition: A | Discharge status: Home / Self Care | Payer: Medicaid Other | Source: Ambulatory Visit | Attending: Plastic Surgery | Admitting: Plastic Surgery

## 2022-10-25 ENCOUNTER — Encounter: Payer: Self-pay | Admitting: Plastic Surgery

## 2022-10-25 ENCOUNTER — Ambulatory Visit (INDEPENDENT_AMBULATORY_CARE_PROVIDER_SITE_OTHER): Payer: Medicaid Other | Admitting: Plastic Surgery

## 2022-10-25 VITALS — BP 115/76 | HR 51

## 2022-10-25 DIAGNOSIS — L819 Disorder of pigmentation, unspecified: Secondary | ICD-10-CM | POA: Diagnosis present

## 2022-10-25 DIAGNOSIS — D22122 Melanocytic nevi of left lower eyelid, including canthus: Secondary | ICD-10-CM | POA: Diagnosis not present

## 2022-10-25 NOTE — Progress Notes (Signed)
Procedure Note  Preoperative Dx: changing skin lesion of left lower eyelid  Postoperative Dx: Same  Procedure: Excision of left lower eyelid changing skin lesion 6 mm  Anesthesia: Lidocaine 1% with 1:100,000 epinephrine  Indication for Procedure: skin lesion  Description of Procedure: Risks and complications were explained to the patient.  Consent was confirmed and the patient understands the risks and benefits.  The potential complications and alternatives were explained and the patient consents.  The patient expressed understanding the option of not having the procedure and the risks of a scar.  Time out was called and all information was confirmed to be correct.    The area was prepped and drapped.  Lidocaine 1% with epinephrine was injected in the subcutaneous area.  After waiting several minutes for the local to take affect a #15 blade was used to excise the area in an eliptical pattern with 2mm borders from the biopsy site.   The skin edges were reapproximated with 6-0 Monocryl.  The patient was given instructions on how to care for the area and a follow up appointment.  Dorothy Schwartz tolerated the procedure well and there were no complications. The specimen was sent to pathology.

## 2022-11-01 LAB — SURGICAL PATHOLOGY

## 2022-11-04 ENCOUNTER — Encounter: Payer: Self-pay | Admitting: Student

## 2022-11-04 ENCOUNTER — Ambulatory Visit (INDEPENDENT_AMBULATORY_CARE_PROVIDER_SITE_OTHER): Payer: Medicaid Other | Admitting: Student

## 2022-11-04 VITALS — BP 124/79 | HR 79

## 2022-11-04 DIAGNOSIS — L819 Disorder of pigmentation, unspecified: Secondary | ICD-10-CM

## 2022-11-04 NOTE — Progress Notes (Signed)
Patient is a 32 year old female who underwent excision of left lower eyelid changing skin lesion with Dr. Ulice Bold on 10/25/2022.  During the procedure, the lesion was excised and the skin edges were reapproximated with 6-0 Monocryl.  Specimen was sent to pathology.  Pathology shows compound nevus.  Patient presents to the clinic today for postprocedural follow-up.  Today, patient reports she is doing well.  She denies any issues with the surgical site.  She denies any drainage.  She denies any fevers or chills.  I discussed the pathology with the patient.  Patient expressed understanding.  On exam, patient is sitting upright in no acute distress.  Surgical site appears to be intact with Monocryl suture.  There is a little bit of mild surrounding irritation.  There is no active drainage on exam.  There is no tenderness to palpation.  Monocryl suture was removed without difficulty.  Patient tolerated well.  I discussed with the patient would like her to apply Vaseline to her incision site for about the next week or so.  I then recommended she transition to silicone scar creams.  Patient expressed understanding.  I instructed the patient to avoid direct sunlight to the procedure site as this can worsen the scar.  I recommended she wear sunscreen as well as coverage such as a hat and/or sunglasses if she is going to be out in the sun.  I also recommended to the patient that she not submerge her incision for the next few days to a week.  Patient expressed understanding.  Patient to follow-up as needed.  I instructed the patient to call back if she had any questions or concerns about anything.

## 2022-11-05 ENCOUNTER — Ambulatory Visit: Payer: Medicaid Other | Admitting: Plastic Surgery

## 2022-12-09 ENCOUNTER — Other Ambulatory Visit: Payer: Self-pay | Admitting: Student

## 2022-12-09 DIAGNOSIS — I10 Essential (primary) hypertension: Secondary | ICD-10-CM

## 2022-12-20 ENCOUNTER — Other Ambulatory Visit: Payer: Self-pay | Admitting: Student

## 2022-12-20 ENCOUNTER — Ambulatory Visit (INDEPENDENT_AMBULATORY_CARE_PROVIDER_SITE_OTHER): Payer: Medicaid Other | Admitting: Family Medicine

## 2022-12-20 ENCOUNTER — Encounter: Payer: Self-pay | Admitting: Family Medicine

## 2022-12-20 VITALS — BP 133/87 | HR 63 | Ht 63.0 in | Wt 198.2 lb

## 2022-12-20 DIAGNOSIS — N898 Other specified noninflammatory disorders of vagina: Secondary | ICD-10-CM

## 2022-12-20 DIAGNOSIS — N390 Urinary tract infection, site not specified: Secondary | ICD-10-CM | POA: Diagnosis present

## 2022-12-20 DIAGNOSIS — B9689 Other specified bacterial agents as the cause of diseases classified elsewhere: Secondary | ICD-10-CM | POA: Diagnosis not present

## 2022-12-20 DIAGNOSIS — N760 Acute vaginitis: Secondary | ICD-10-CM

## 2022-12-20 DIAGNOSIS — I10 Essential (primary) hypertension: Secondary | ICD-10-CM

## 2022-12-20 MED ORDER — METRONIDAZOLE 500 MG PO TABS: 500.0000 mg | ORAL_TABLET | Freq: Three times a day (TID) | ORAL | 3 refills | Status: DC

## 2022-12-20 NOTE — Patient Instructions (Signed)
Today you were prescribed antibiotics for possible Bacterial Vaginosis. Please take these as prescribed. If you have any abnormal symptoms, of symptoms do not go away, please return to the clinic for evaluation.  Thanks for allowing me to take care of you today!  Cyndia Skeeters, DO [facility], PGY-1

## 2022-12-20 NOTE — Progress Notes (Unsigned)
    SUBJECTIVE:   CHIEF COMPLAINT / HPI:   This is a pleasant 32 year old woman seen today for complaint of possible UTI and bacterial vaginosis.  Patient states she has BV often and strongly suspects this is what she has today. She reports itching, foul odor, and discharge which has been ongoing for several days. She states she has had many episodes of BV in her life, usually 2-3 a year. She is always treated with a 7 day course of Abx with relief.    PERTINENT  PMH / PSH:  Past Medical History:  Diagnosis Date   Anemia 2011   postop anemia after CS   Headache(784.0)    Hypertension    Kidney stones    Nephrolithiasis 03/25/2017   Palpitations    per pt due to stress     OBJECTIVE:   BP 133/87   Pulse 63   Ht 5\' 3"  (1.6 m)   Wt 198 lb 3.2 oz (89.9 kg)   SpO2 100%   BMI 35.11 kg/m   General: well-appearing, no acute distress Cardio: RRR Pulm: No increased work of breathing Abdominal: soft, non-tender, non-distended  ASSESSMENT/PLAN:   Bacterial vaginosis Patient has had many episodes of BV previously. Based on discussion and patient desire to avoid speculum exam, it is reasonable to provide 7 day course of Abx for treatment without speculum exam. Advised patient to return if any abnormal symptoms or symptoms that do not resolve with Abx treatment. - Prescribed Flagyl 500mg  TID x7 days - UA to rule out UTI     Cyndia Skeeters, DO [facility]

## 2022-12-20 NOTE — Assessment & Plan Note (Addendum)
Patient has had many episodes of BV previously. Based on discussion and patient desire to avoid speculum exam, it is reasonable to provide 7 day course of Abx for treatment without speculum exam. Advised patient to return if any abnormal symptoms or symptoms that do not resolve with Abx treatment. - Prescribed Flagyl 500mg  TID x7 days - UA to rule out UTI

## 2023-01-23 ENCOUNTER — Ambulatory Visit (INDEPENDENT_AMBULATORY_CARE_PROVIDER_SITE_OTHER): Payer: Medicaid Other

## 2023-01-23 ENCOUNTER — Ambulatory Visit (HOSPITAL_COMMUNITY)
Admission: EM | Admit: 2023-01-23 | Discharge: 2023-01-23 | Disposition: A | Discharge status: Home / Self Care | Payer: Medicaid Other | Attending: Nurse Practitioner | Admitting: Nurse Practitioner

## 2023-01-23 ENCOUNTER — Encounter (HOSPITAL_COMMUNITY): Payer: Self-pay | Admitting: *Deleted

## 2023-01-23 DIAGNOSIS — M79642 Pain in left hand: Secondary | ICD-10-CM

## 2023-01-23 NOTE — Discharge Instructions (Signed)
The hand xray does not show any broken bones or fractures  Recommend applying ice to your hand 15 minutes on, 45 minutes off every hour while awake.  You can take Tylenol 500 to 1000 mg every 6 hours alternating with ibuprofen 400 to 600 mg every 8 hours for pain.  Care if your symptoms worsen despite treatment.
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
## 2023-02-06 ENCOUNTER — Encounter: Payer: Self-pay | Admitting: Family Medicine

## 2023-02-06 ENCOUNTER — Ambulatory Visit: Payer: Medicaid Other | Admitting: Family Medicine

## 2023-02-06 ENCOUNTER — Ambulatory Visit (HOSPITAL_COMMUNITY)
Admission: RE | Admit: 2023-02-06 | Discharge: 2023-02-06 | Disposition: A | Discharge status: Home / Self Care | Payer: Medicaid Other | Source: Ambulatory Visit | Attending: Family Medicine | Admitting: Family Medicine

## 2023-02-06 VITALS — BP 120/86 | HR 67 | Ht 63.0 in | Wt 196.0 lb

## 2023-02-06 DIAGNOSIS — G44311 Acute post-traumatic headache, intractable: Secondary | ICD-10-CM | POA: Diagnosis present

## 2023-02-06 DIAGNOSIS — S0990XA Unspecified injury of head, initial encounter: Secondary | ICD-10-CM

## 2023-02-06 DIAGNOSIS — I10 Essential (primary) hypertension: Secondary | ICD-10-CM

## 2023-02-06 DIAGNOSIS — X58XXXA Exposure to other specified factors, initial encounter: Secondary | ICD-10-CM | POA: Diagnosis not present

## 2023-02-06 MED ORDER — ONDANSETRON HCL 4 MG PO TABS: 4.0000 mg | ORAL_TABLET | Freq: Three times a day (TID) | ORAL | 0 refills | Status: DC | PRN

## 2023-02-06 MED ORDER — OLMESARTAN MEDOXOMIL 20 MG PO TABS
20.0000 mg | ORAL_TABLET | Freq: Every day | ORAL | 1 refills | Status: DC
Start: 2023-02-06 — End: 2023-09-26

## 2023-02-06 MED ORDER — HYDROCODONE-ACETAMINOPHEN 5-325 MG PO TABS
1.0000 | ORAL_TABLET | Freq: Four times a day (QID) | ORAL | 0 refills | Status: DC | PRN
Start: 2023-02-06 — End: 2023-09-26
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
## 2023-02-07 ENCOUNTER — Encounter: Payer: Self-pay | Admitting: Family Medicine

## 2023-02-07 DIAGNOSIS — S060XAA Concussion with loss of consciousness status unknown, initial encounter: Secondary | ICD-10-CM | POA: Insufficient documentation

## 2023-02-07 DIAGNOSIS — S0990XA Unspecified injury of head, initial encounter: Secondary | ICD-10-CM | POA: Insufficient documentation

## 2023-02-07 NOTE — Assessment & Plan Note (Signed)
Established problem Patient about to run out of olmesartan. Temporay supple given while awaiting appointment with PCP. Make sure appointment made at follow up visit.
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
## 2023-02-19 ENCOUNTER — Other Ambulatory Visit: Payer: Self-pay | Admitting: Student

## 2023-02-19 DIAGNOSIS — I10 Essential (primary) hypertension: Secondary | ICD-10-CM

## 2023-02-24 ENCOUNTER — Ambulatory Visit (INDEPENDENT_AMBULATORY_CARE_PROVIDER_SITE_OTHER): Payer: Medicaid Other | Admitting: Student

## 2023-02-24 VITALS — BP 125/60 | HR 59 | Ht 63.0 in | Wt 193.8 lb

## 2023-02-24 DIAGNOSIS — E669 Obesity, unspecified: Secondary | ICD-10-CM | POA: Diagnosis present

## 2023-02-24 DIAGNOSIS — S060X0D Concussion without loss of consciousness, subsequent encounter: Secondary | ICD-10-CM

## 2023-02-24 DIAGNOSIS — Z6834 Body mass index (BMI) 34.0-34.9, adult: Secondary | ICD-10-CM

## 2023-02-24 DIAGNOSIS — I10 Essential (primary) hypertension: Secondary | ICD-10-CM | POA: Diagnosis not present
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
## 2023-02-24 NOTE — Patient Instructions (Addendum)
It was great to see you! Thank you for allowing me to participate in your care!   Our plans for today:  - I am going to call radiology about the Head CT - You are not cleared for training tomorrow as the short term memory impairments are significant - I would like to refer to neurology for these memory issues  Take care and seek immediate care sooner if you develop any concerns.  Levin Erp, MD
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
## 2023-02-24 NOTE — Assessment & Plan Note (Signed)
Patient's blood pressure is controlled today. BP: 125/60. Goal of 130/80. Patient's medication regimen includes  amlodipine 10, benicar 20. -Changes to current regimen include none, declines consolidation into one medication

## 2023-02-25 LAB — TSH RFX ON ABNORMAL TO FREE T4: TSH: 0.48 u[IU]/mL (ref 0.450–4.500)

## 2023-03-06 ENCOUNTER — Ambulatory Visit: Payer: Medicaid Other | Admitting: Family Medicine

## 2023-03-11 ENCOUNTER — Ambulatory Visit: Payer: Medicaid Other | Admitting: Family Medicine

## 2023-04-04 ENCOUNTER — Other Ambulatory Visit: Payer: Self-pay | Admitting: Student

## 2023-04-04 DIAGNOSIS — I10 Essential (primary) hypertension: Secondary | ICD-10-CM

## 2023-04-08 IMAGING — CT CT HEAD W/O CM
4 series · 16 of 47 positions shown, 18 images · non-contrast
Comparison: None.

CLINICAL DATA: Headache, new or worsening (Age >= 50y)

EXAM:
CT HEAD WITHOUT CONTRAST
TECHNIQUE: Contiguous axial images were obtained from the base of the skull
through the vertex without intravenous contrast.

[Series 3: head wo · axial · 0.44mm/px · z∈[+1352,+1458]mm · 7 of 29 slices shown, 9 images]
[im 4/29  brain]
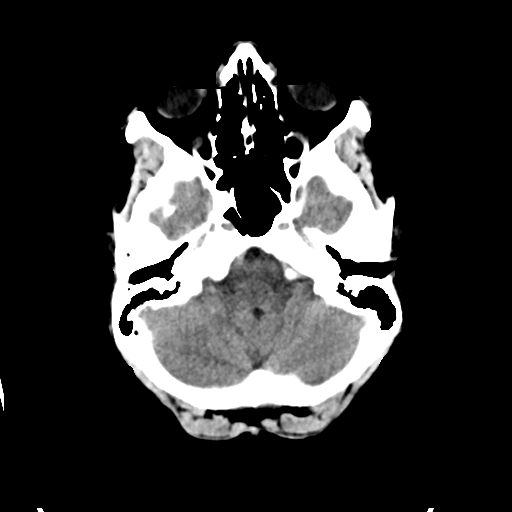
[im 4/29  bone]
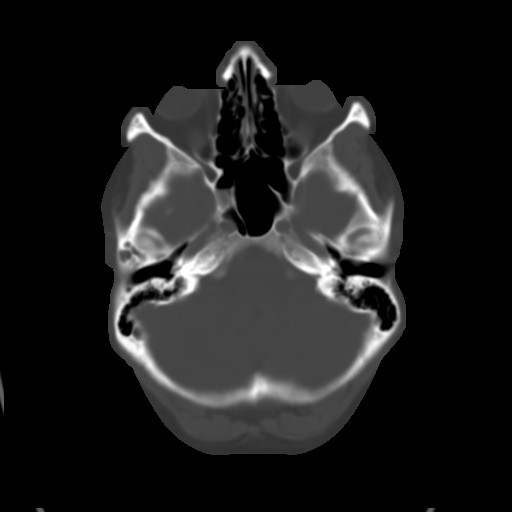
[im 8/29  brain]
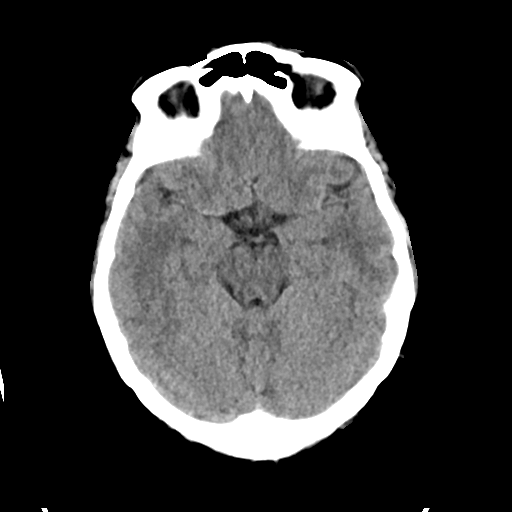
[im 11/29  brain]
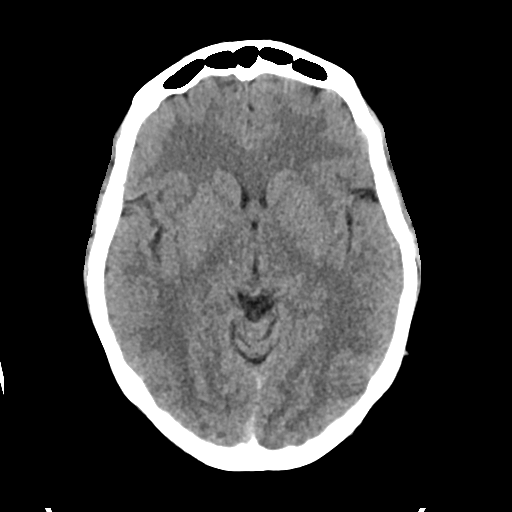
[im 15/29  brain]
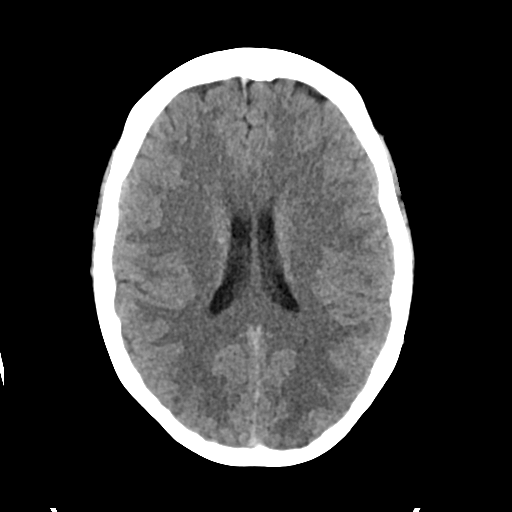
[im 18/29  brain]
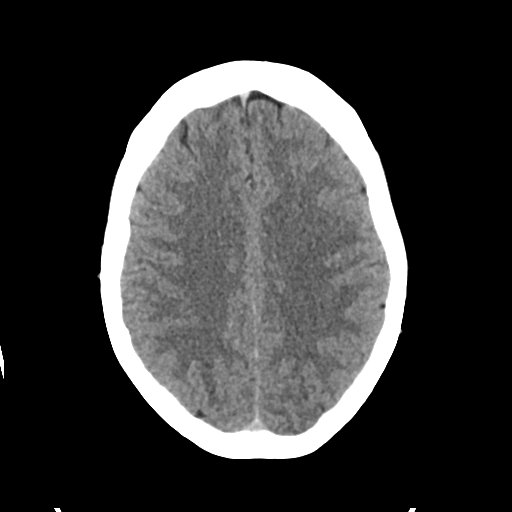
[im 18/29  bone]
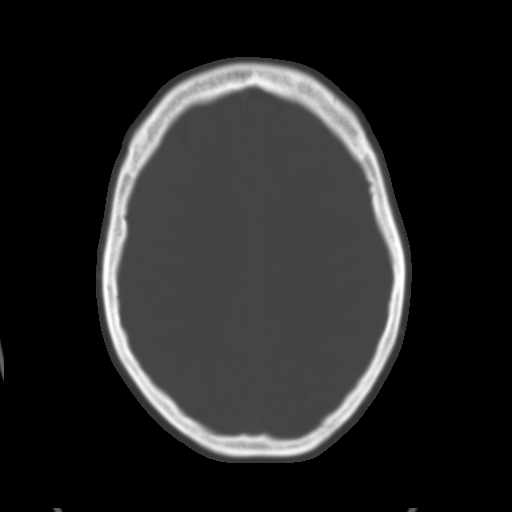
[im 22/29  brain]
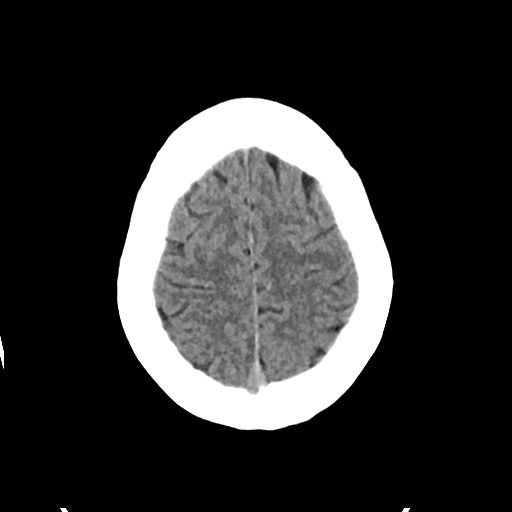
[im 25/29  brain]
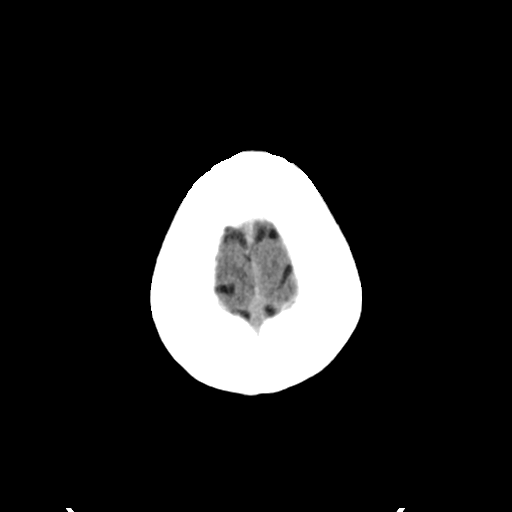

[Series 4: head bone · axial · 0.44mm/px · z∈[+1352,+1380]mm · 3 of 73 slices shown]
[im 8/73  bone]
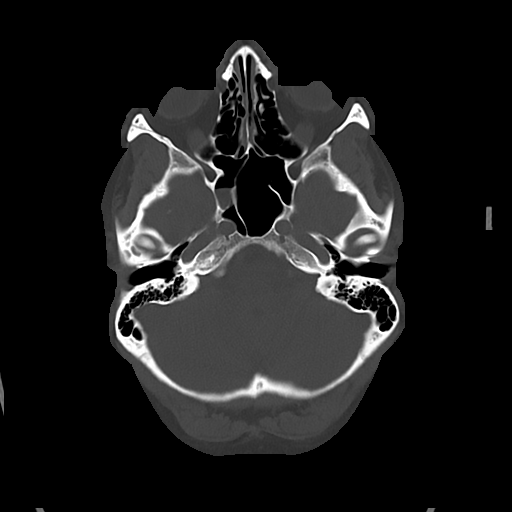
[im 15/73  bone]
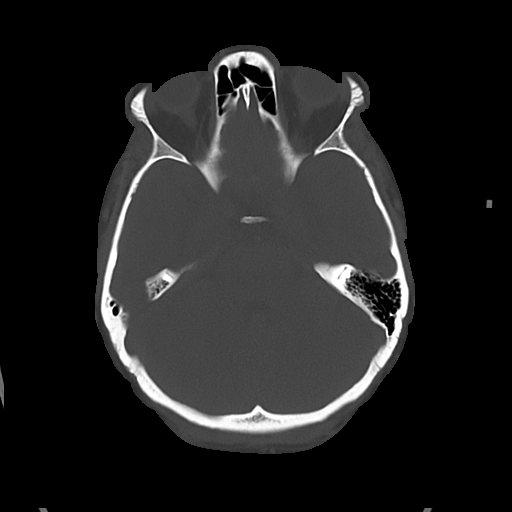
[im 22/73  bone]
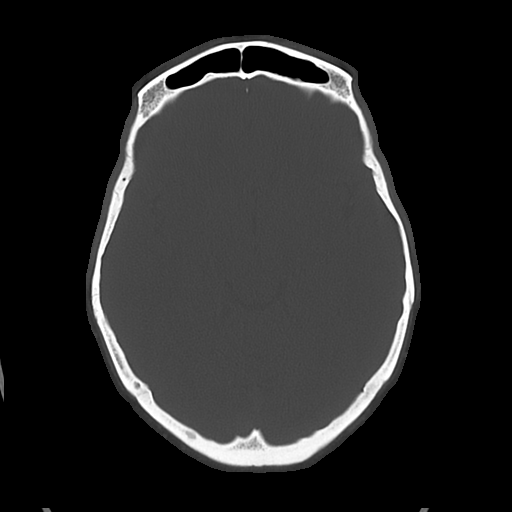

[Series 5: cor soft · coronal · 0.29mm/px · 3 of 73 slices shown]
[im 25/73  brain]
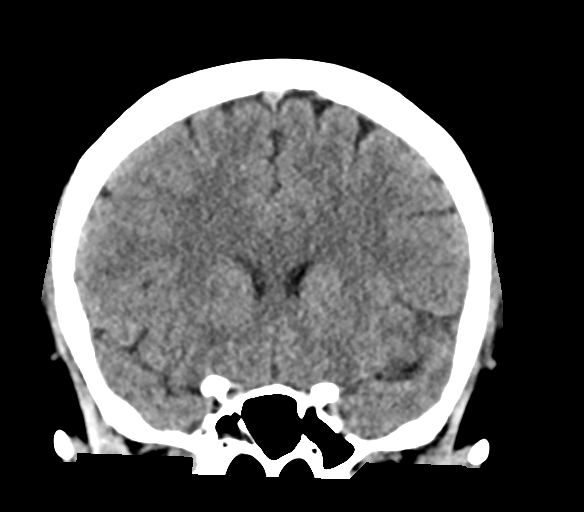
[im 33/73  brain]
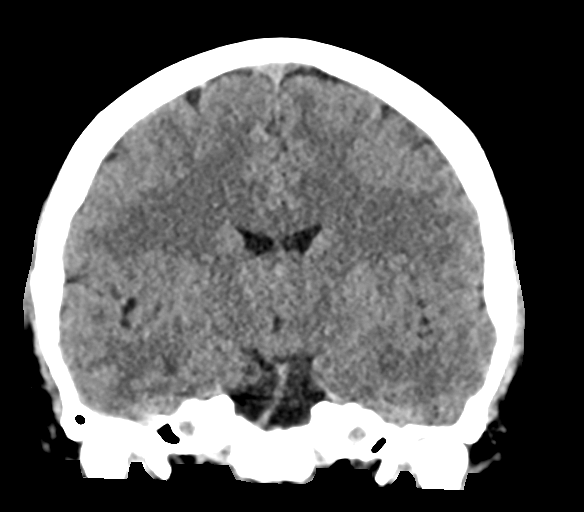
[im 41/73  brain]
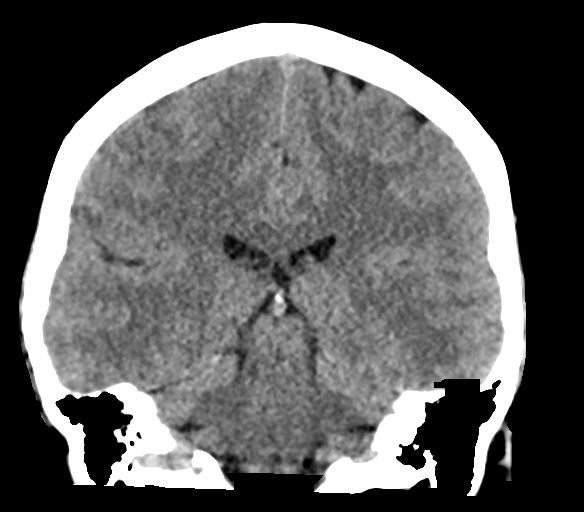

[Series 6: sag soft · sagittal · 0.30mm/px · 3 of 57 slices shown]
[im 19/57  brain]
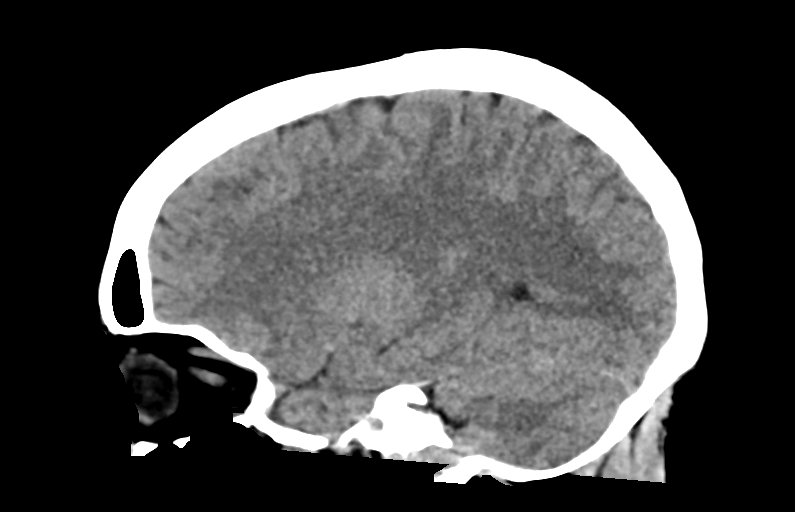
[im 29/57  brain]
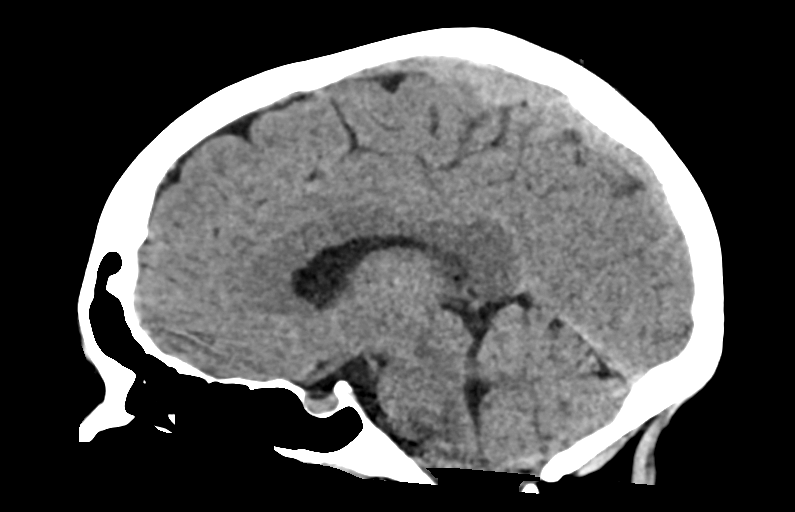
[im 38/57  brain]
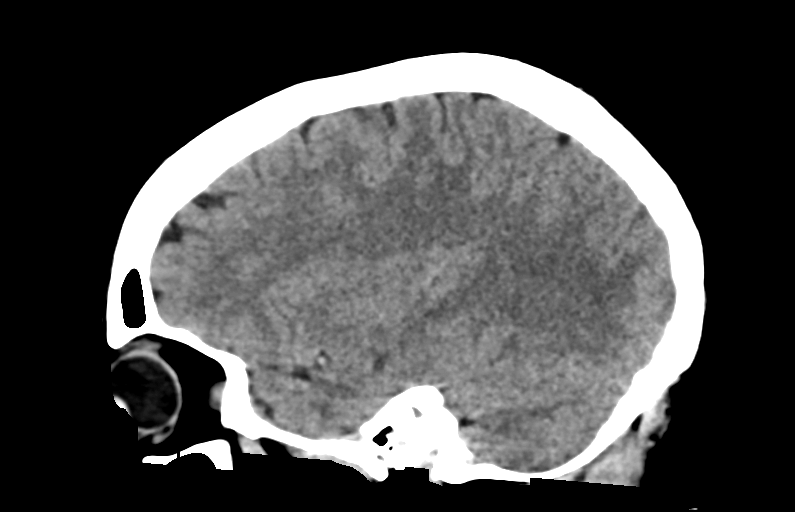

[16 of 47 positions shown; findings below may reference images not displayed]

FINDINGS: Brain: No evidence of acute intracranial hemorrhage or extra-axial
collection.No evidence of mass lesion/concern mass effect.The
ventricles are normal in size.

Vascular: No hyperdense vessel or unexpected calcification.

Skull: Normal. Negative for fracture or focal lesion.

Sinuses/Orbits: No acute finding.

Other: None.
IMPRESSION: No acute intracranial abnormality.

## 2023-05-01 ENCOUNTER — Other Ambulatory Visit: Payer: Self-pay | Admitting: Student

## 2023-05-01 DIAGNOSIS — I10 Essential (primary) hypertension: Secondary | ICD-10-CM

## 2023-05-20 ENCOUNTER — Ambulatory Visit (INDEPENDENT_AMBULATORY_CARE_PROVIDER_SITE_OTHER): Payer: Medicaid Other

## 2023-05-20 ENCOUNTER — Encounter (HOSPITAL_COMMUNITY): Payer: Self-pay

## 2023-05-20 ENCOUNTER — Ambulatory Visit (HOSPITAL_COMMUNITY)
Admission: EM | Admit: 2023-05-20 | Discharge: 2023-05-20 | Disposition: A | Discharge status: Home / Self Care | Payer: Medicaid Other

## 2023-05-20 DIAGNOSIS — M25562 Pain in left knee: Secondary | ICD-10-CM | POA: Diagnosis not present

## 2023-05-20 DIAGNOSIS — S8392XA Sprain of unspecified site of left knee, initial encounter: Secondary | ICD-10-CM | POA: Diagnosis not present

## 2023-05-20 NOTE — ED Provider Notes (Signed)
[facility]    CSN: [id number] Arrival date & time: 05/20/23  5784      History   Chief Complaint Chief Complaint  Patient presents with   Knee Pain    HPI Dorothy Schwartz is a 32 y.o. female.   Patient presents today with 1 day history of left knee pain that began after she was wrestling with a family member last night.  Reports while they were wrestling, she heard a loud pop come from her left knee and began having pain.  Reports she was drinking alcohol at the time and the pain was not very severe.  She went home and rested and woke up this morning and the pain was much worse.  She also endorses swelling to the left knee.  No bruising, warmth, or redness.  Reports she has been able to walk on it, but feels like is going to give way on her when she walks.    Past Medical History:  Diagnosis Date   Anemia 2011   postop anemia after CS   Headache(784.0)    Hypertension    Kidney stones    Nephrolithiasis 03/25/2017   Palpitations    per pt due to stress    Patient Active Problem List   Diagnosis Date Noted   Concussion 02/07/2023   Changing pigmented skin lesion 07/15/2022   Family history of colon cancer 10/02/2020   Abnormal cervical Papanicolaou smear 04/01/2019   Anxiety 11/03/2018   HTN (hypertension) 03/25/2017    Past Surgical History:  Procedure Laterality Date   CESAREAN SECTION     CESAREAN SECTION WITH BILATERAL TUBAL LIGATION Bilateral 08/13/2012   Procedure: CESAREAN SECTION WITH BILATERAL TUBAL LIGATION;  Surgeon: Meriel Pica, MD;  Location: [facility];  Service: Obstetrics;  Laterality: Bilateral;  Repeat edc 08/09/12   ORIF ANKLE FRACTURE Left 07/19/2018   Procedure: OPEN REDUCTION INTERNAL FIXATION (ORIF) ANKLE FRACTURE;  Surgeon: Christena Flake, MD;  Location: [facility];  Service: Orthopedics;  Laterality: Left;   WISDOM TOOTH EXTRACTION      OB History     Gravida  2   Para  2   Term  2   Preterm      AB      Living  2       SAB      IAB      Ectopic      Multiple      Live Births  1            Home Medications    Prior to Admission medications   Medication Sig Start Date End Date Taking? Authorizing Provider  ALPRAZolam Prudy Feeler) 1 MG tablet Take 1 mg by mouth at bedtime as needed for sleep.   Yes [provider]  amLODipine (NORVASC) 10 MG tablet TAKE 1 TABLET BY MOUTH EVERY DAY 05/01/23   Levin Erp, MD  fluticasone (FLONASE) 50 MCG/ACT nasal spray Place 2 sprays into both nostrils daily. 08/20/22   Levin Erp, MD  HYDROcodone-acetaminophen (NORCO/VICODIN) 5-325 MG tablet Take 1 tablet by mouth every 6 (six) hours as needed for moderate pain. 02/06/23   McDiarmid, Leighton Roach, MD  olmesartan (BENICAR) 20 MG tablet Take 1 tablet (20 mg total) by mouth daily. 02/06/23   McDiarmid, Leighton Roach, MD  ondansetron (ZOFRAN) 4 MG tablet Take 1 tablet (4 mg total) by mouth every 8 (eight) hours as needed for nausea or vomiting. 02/06/23   McDiarmid, Leighton Roach, MD  Family History Family History  Problem Relation Age of Onset   Hypertension Mother    Mood Disorder Mother    Lung cancer Mother        tobacco use   Colon cancer Mother 76   Colon cancer Father 73   Colon polyps Sister    Lung cancer Maternal Grandmother    Other Neg Hx     Social History Social History   Tobacco Use   Smoking status: Former    Current packs/day: 1.00    Average packs/day: 1 pack/day for 21.0 years (21.0 ttl pk-yrs)    Types: Cigarettes    Start date: 2004   Smokeless tobacco: Never   Tobacco comments:    Quit with both pregnancy  Vaping Use   Vaping status: Never Used  Substance Use Topics   Alcohol use: Yes    Comment: occassional   Drug use: Not Currently    Types: Benzodiazepines     Allergies   Morphine and codeine and Percocet [oxycodone-acetaminophen]   Review of Systems Review of Systems Per HPI  Physical Exam Triage Vital Signs ED Triage Vitals  Encounter Vitals Group     BP  05/20/23 0954 (!) 157/93     Systolic BP Percentile --      Diastolic BP Percentile --      Pulse Rate 05/20/23 0954 65     Resp 05/20/23 0954 18     Temp 05/20/23 0954 98.2 F (36.8 C)     Temp Source 05/20/23 0954 Oral     SpO2 05/20/23 0954 95 %     Weight 05/20/23 0951 190 lb (86.2 kg)     Height 05/20/23 0951 5\' 3"  (1.6 m)     Head Circumference --      Peak Flow --      Pain Score 05/20/23 0950 9     Pain Loc --      Pain Education --      Exclude from Growth Chart --    No data found.  Updated Vital Signs BP (!) 157/93 (BP Location: Left Arm)   Pulse 65   Temp 98.2 F (36.8 C) (Oral)   Resp 18   Ht 5\' 3"  (1.6 m)   Wt 190 lb (86.2 kg)   LMP 04/27/2023 (Exact Date)   SpO2 95%   BMI 33.66 kg/m   Visual Acuity Right Eye Distance:   Left Eye Distance:   Bilateral Distance:    Right Eye Near:   Left Eye Near:    Bilateral Near:     Physical Exam Vitals and nursing note reviewed.  Constitutional:      General: She is not in acute distress.    Appearance: Normal appearance. She is not toxic-appearing.  HENT:     Mouth/Throat:     Mouth: Mucous membranes are moist.     Pharynx: Oropharynx is clear.  Pulmonary:     Effort: Pulmonary effort is normal. No respiratory distress.  Musculoskeletal:     Comments: Inspection: Mild swelling and early bruising noted to left patella and left lateral joint line of the knee; no obvious deformity, redness Palpation: tender to palpation left knee lateral joint line; no obvious deformities palpated ROM: Full ROM to left knee; no laxity appreciated Pain with flexion and extension of the left knee Strength: Difficult to assess secondary to pain Neurovascular: neurovascularly intact in bilateral lower extremities  Skin:    General: Skin is warm and dry.  Capillary Refill: Capillary refill takes less than 2 seconds.     Coloration: Skin is not jaundiced or pale.     Findings: No erythema.  Neurological:     Mental  Status: She is alert and oriented to person, place, and time.  Psychiatric:        Behavior: Behavior is cooperative.      UC Treatments / Results  Labs (all labs ordered are listed, but only abnormal results are displayed) Labs Reviewed - No data to display  EKG   Radiology DG Knee Complete 4 Views Left Result Date: 05/20/2023 CLINICAL DATA:  Left knee pain after possible injury yesterday. EXAM: LEFT KNEE - COMPLETE 4+ VIEW COMPARISON:  None Available. FINDINGS: No evidence of fracture, dislocation, or joint effusion. No evidence of arthropathy or other focal bone abnormality. Soft tissues are unremarkable. IMPRESSION: Negative. Electronically Signed   By: Lupita Raider M.D.   On: 05/20/2023 10:44    Procedures Procedures (including critical care time)  Medications Ordered in UC Medications - No data to display  Initial Impression / Assessment and Plan / UC Course  I have reviewed the triage vital signs and the nursing notes.  Pertinent labs & imaging results that were available during my care of the patient were reviewed by me and considered in my medical decision making (see chart for details).   Patient is well-appearing, afebrile, not tachycardic, not tachypneic, oxygenating well on room air.  Patient is mildly hypertensive in triage today.  Otherwise, vital signs are stable.  1. Acute pain of left knee 2. Sprain of left knee, unspecified ligament, initial encounter Concern for sprain or partially torn ligament of left knee X-ray imaging negative today Patient placed in knee immobilizer, given crutches, recommended nonweightbearing status and close follow-up with Ortho Pain control discussed Work excuse provided  The patient was given the opportunity to ask questions.  All questions answered to their satisfaction.  The patient is in agreement to this plan.    Final Clinical Impressions(s) / UC Diagnoses   Final diagnoses:  Acute pain of left knee  Sprain of left  knee, unspecified ligament, initial encounter     Discharge Instructions      As we discussed, I suspect you have a knee sprain.  The x-ray imaging is negative today.  Please wear the knee immobilizer and do not bear any weight on your left knee until you are able to follow-up with an orthopedic provider.  Apply ice when sitting down 15 minutes on, 45 minutes off every hour while awake.  You can take Tylenol 500 to 1000 mg every 6 hours and ibuprofen 800 mg every 8 hours for pain.    ED Prescriptions   None    PDMP not reviewed this encounter.   Valentino Nose, NP 05/20/23 1218

## 2023-05-20 NOTE — Discharge Instructions (Addendum)
As we discussed, I suspect you have a knee sprain.  The x-ray imaging is negative today.  Please wear the knee immobilizer and do not bear any weight on your left knee until you are able to follow-up with an orthopedic provider.  Apply ice when sitting down 15 minutes on, 45 minutes off every hour while awake.  You can take Tylenol 500 to 1000 mg every 6 hours and ibuprofen 800 mg every 8 hours for pain.

## 2023-05-20 NOTE — ED Triage Notes (Signed)
Pt presents with left knee pain after "wrestling with a family member last night." Pt states she heard a "pop." This morning, pt woke up with 9/10 left knee pain and swelling. Pt denies taking medications for pain. Pt is ambulatory to triage (limping).

## 2023-06-23 ENCOUNTER — Telehealth: Payer: 59 | Admitting: Physician Assistant

## 2023-06-23 DIAGNOSIS — J0110 Acute frontal sinusitis, unspecified: Secondary | ICD-10-CM

## 2023-06-23 MED ORDER — AMOXICILLIN-POT CLAVULANATE 875-125 MG PO TABS: 1.0000 | ORAL_TABLET | Freq: Two times a day (BID) | ORAL | 0 refills | Status: DC

## 2023-06-23 MED ORDER — FLUTICASONE PROPIONATE 50 MCG/ACT NA SUSP: 2.0000 | Freq: Every day | NASAL | 6 refills | Status: DC

## 2023-06-23 NOTE — Patient Instructions (Signed)
Pilar Plate, thank you for joining [name], PA-C for today's virtual visit.  While this provider is not your primary care provider (PCP), if your PCP is located in our provider database this encounter information will be shared with them immediately following your visit.   A [facility] MyChart account gives you access to today's visit and all your visits, tests, and labs performed at [facility] " click here if you don't have a [facility] MyChart account or go to [email or web address]  Consent: (Patient) Dorothy Schwartz provided verbal consent for this virtual visit at the beginning of the encounter.  Current Medications:  Current Outpatient Medications:    amoxicillin-clavulanate (AUGMENTIN) 875-125 MG tablet, Take 1 tablet by mouth 2 (two) times daily., Disp: 20 tablet, Rfl: 0   ALPRAZolam (XANAX) 1 MG tablet, Take 1 mg by mouth at bedtime as needed for sleep., Disp: , Rfl:    amLODipine (NORVASC) 10 MG tablet, TAKE 1 TABLET BY MOUTH EVERY DAY, Disp: 90 tablet, Rfl: 0   fluticasone (FLONASE) 50 MCG/ACT nasal spray, Place 2 sprays into both nostrils daily., Disp: 16 g, Rfl: 6   HYDROcodone-acetaminophen (NORCO/VICODIN) 5-325 MG tablet, Take 1 tablet by mouth every 6 (six) hours as needed for moderate pain., Disp: 20 tablet, Rfl: 0   olmesartan (BENICAR) 20 MG tablet, Take 1 tablet (20 mg total) by mouth daily., Disp: 90 tablet, Rfl: 1   ondansetron (ZOFRAN) 4 MG tablet, Take 1 tablet (4 mg total) by mouth every 8 (eight) hours as needed for nausea or vomiting., Disp: 20 tablet, Rfl: 0   Medications ordered in this encounter:  Meds ordered this encounter  Medications   fluticasone (FLONASE) 50 MCG/ACT nasal spray    Sig: Place 2 sprays into both nostrils daily.    Dispense:  16 g    Refill:  6    Supervising Provider:   Merrilee Jansky [[id number]]   amoxicillin-clavulanate (AUGMENTIN) 875-125 MG tablet    Sig: Take 1 tablet by mouth 2 (two) times daily.     Dispense:  20 tablet    Refill:  0    Supervising Provider:   Merrilee Jansky [[id number]]     *If you need refills on other medications prior to your next appointment, please contact your pharmacy*  Follow-Up: Call back or seek an in-person evaluation if the symptoms worsen or if the condition fails to improve as anticipated.  [facility] [phone]  Other Instructions Sinus Infection, Adult A sinus infection, also called sinusitis, is inflammation of your sinuses. Sinuses are hollow spaces in the bones around your face. Your sinuses are located: Around your eyes. In the middle of your forehead. Behind your nose. In your cheekbones. Mucus normally drains out of your sinuses. When your nasal tissues become inflamed or swollen, mucus can become trapped or blocked. This allows bacteria, viruses, and fungi to grow, which leads to infection. Most infections of the sinuses are caused by a virus. A sinus infection can develop quickly. It can last for up to 4 weeks (acute) or for more than 12 weeks (chronic). A sinus infection often develops after a cold. What are the causes? This condition is caused by anything that creates swelling in the sinuses or stops mucus from draining. This includes: Allergies. Asthma. Infection from bacteria or viruses. Deformities or blockages in your nose or sinuses. Abnormal growths in the nose (nasal polyps). Pollutants, such as chemicals or irritants in the air. Infection from fungi.  This is rare. What increases the risk? You are more likely to develop this condition if you: Have a weak body defense system (immune system). Do a lot of swimming or diving. Overuse nasal sprays. Smoke. What are the signs or symptoms? The main symptoms of this condition are pain and a feeling of pressure around the affected sinuses. Other symptoms include: Stuffy nose or congestion that makes it difficult to breathe through your nose. Thick yellow or  greenish drainage from your nose. Tenderness, swelling, and warmth over the affected sinuses. A cough that may get worse at night. Decreased sense of smell and taste. Extra mucus that collects in the throat or the back of the nose (postnasal drip) causing a sore throat or bad breath. Tiredness (fatigue). Fever. How is this diagnosed? This condition is diagnosed based on: Your symptoms. Your medical history. A physical exam. Tests to find out if your condition is acute or chronic. This may include: Checking your nose for nasal polyps. Viewing your sinuses using a device that has a light (endoscope). Testing for allergies or bacteria. Imaging tests, such as an MRI or CT scan. In rare cases, a bone biopsy may be done to rule out more serious types of fungal sinus disease. How is this treated? Treatment for a sinus infection depends on the cause and whether your condition is chronic or acute. If caused by a virus, your symptoms should go away on their own within 10 days. You may be given medicines to relieve symptoms. They include: Medicines that shrink swollen nasal passages (decongestants). A spray that eases inflammation of the nostrils (topical intranasal corticosteroids). Rinses that help get rid of thick mucus in your nose (nasal saline washes). Medicines that treat allergies (antihistamines). Over-the-counter pain relievers. If caused by bacteria, your health care provider may recommend waiting to see if your symptoms improve. Most bacterial infections will get better without antibiotic medicine. You may be given antibiotics if you have: A severe infection. A weak immune system. If caused by narrow nasal passages or nasal polyps, surgery may be needed. Follow these instructions at home: Medicines Take, use, or apply over-the-counter and prescription medicines only as told by your health care provider. These may include nasal sprays. If you were prescribed an antibiotic medicine,  take it as told by your health care provider. Do not stop taking the antibiotic even if you start to feel better. Hydrate and humidify  Drink enough fluid to keep your urine pale yellow. Staying hydrated will help to thin your mucus. Use a cool mist humidifier to keep the humidity level in your home above 50%. Inhale steam for 10-15 minutes, 3-4 times a day, or as told by your health care provider. You can do this in the bathroom while a hot shower is running. Limit your exposure to cool or dry air. Rest Rest as much as possible. Sleep with your head raised (elevated). Make sure you get enough sleep each night. General instructions  Apply a warm, moist washcloth to your face 3-4 times a day or as told by your health care provider. This will help with discomfort. Use nasal saline washes as often as told by your health care provider. Wash your hands often with soap and water to reduce your exposure to germs. If soap and water are not available, use hand sanitizer. Do not smoke. Avoid being around people who are smoking (secondhand smoke). Keep all follow-up visits. This is important. Contact a health care provider if: You have a fever. Your  symptoms get worse. Your symptoms do not improve within 10 days. Get help right away if: You have a severe headache. You have persistent vomiting. You have severe pain or swelling around your face or eyes. You have vision problems. You develop confusion. Your neck is stiff. You have trouble breathing. These symptoms may be an emergency. Get help right away. Call 911. Do not wait to see if the symptoms will go away. Do not drive yourself to the hospital. Summary A sinus infection is soreness and inflammation of your sinuses. Sinuses are hollow spaces in the bones around your face. This condition is caused by nasal tissues that become inflamed or swollen. The swelling traps or blocks the flow of mucus. This allows bacteria, viruses, and fungi to  grow, which leads to infection. If you were prescribed an antibiotic medicine, take it as told by your health care provider. Do not stop taking the antibiotic even if you start to feel better. Keep all follow-up visits. This is important. This information is not intended to replace advice given to you by your health care provider. Make sure you discuss any questions you have with your health care provider. Document Revised: 04/17/2021 Document Reviewed: 04/17/2021 Elsevier Patient Education  2024 [employer]   If you have been instructed to have an in-person evaluation today at a local Urgent Care facility, please use the link below. It will take you to a list of all of our available [facility], including address, phone number and hours of operation. Please do not delay care.  [facility]  If you or a family member do not have a primary care provider, use the link below to schedule a visit and establish care. When you choose a [facility] primary care physician or advanced practice provider, you gain a long-term partner in health. Find a Primary Care Provider  Learn more about [facility]'s in-office and virtual care options: [facility] - Get Care Now

## 2023-06-23 NOTE — Progress Notes (Signed)
Virtual Visit Consent   Dorothy Schwartz, you are scheduled for a virtual visit with a [facility] provider today. Just as with appointments in the office, your consent must be obtained to participate. Your consent will be active for this visit and any virtual visit you may have with one of our providers in the next 365 days. If you have a MyChart account, a copy of this consent can be sent to you electronically.  As this is a virtual visit, video technology does not allow for your provider to perform a traditional examination. This may limit your provider's ability to fully assess your condition. If your provider identifies any concerns that need to be evaluated in person or the need to arrange testing (such as labs, EKG, etc.), we will make arrangements to do so. Although advances in technology are sophisticated, we cannot ensure that it will always work on either your end or our end. If the connection with a video visit is poor, the visit may have to be switched to a telephone visit. With either a video or telephone visit, we are not always able to ensure that we have a secure connection.  By engaging in this virtual visit, you consent to the provision of healthcare and authorize for your insurance to be billed (if applicable) for the services provided during this visit. Depending on your insurance coverage, you may receive a charge related to this service.  I need to obtain your verbal consent now. Are you willing to proceed with your visit today? Dorothy Schwartz has provided verbal consent on 06/23/2023 for a virtual visit (video or telephone). Dorothy Jaffe, PA-C  Date: 06/23/2023 5:38 PM  Virtual Visit via Video Note   I, Dorothy Schwartz, connected with  Dorothy Schwartz  ([id number], [birth year]) on 06/23/23 at  5:30 PM EST by a video-enabled telemedicine application and verified that I am speaking with the correct person using two identifiers.  Location: Patient: Virtual Visit Location Patient:  Home Provider: Virtual Visit Location Provider: Home Office   I discussed the limitations of evaluation and management by telemedicine and the availability of in person appointments. The patient expressed understanding and agreed to proceed.    History of Present Illness: Dorothy Schwartz is a 33 y.o. who identifies as a female who was assigned female at birth,  presents with a five-day history of a sore throat and severe sinus pressure. The symptoms began with a sensation of chest tightness and a scratchy throat, which has since progressed to constant throat pain, particularly during swallowing. The patient reports that her throat was swollen, and her voice has been affected. Accompanying these symptoms is significant nasal congestion, leading to a feeling of intense pressure in the forehead area. The patient has been using Afrin to alleviate the congestion and reports green nasal discharge. Despite these interventions, the patient has not noticed significant improvement in her condition.  The patient denies having a fever but acknowledges being around others who have been ill. Her appetite has been reduced due to an inability to smell or taste, but she has been maintaining fluid intake. Over-the-counter medications, including NyQuil and Afrin, have been used with limited relief. She has not taken any antibiotics in the last thirty days and reports no allergies to antibiotics.    Problems:  Patient Active Problem List   Diagnosis Date Noted   Concussion 02/07/2023   Changing pigmented skin lesion 07/15/2022   Family history of colon cancer 10/02/2020  Abnormal cervical Papanicolaou smear 04/01/2019   Anxiety 11/03/2018   HTN (hypertension) 03/25/2017    Allergies:  Allergies  Allergen Reactions   Morphine And Codeine Shortness Of Breath    Pt. States she can tolerate Dilaudid & has had it before.   Percocet [Oxycodone-Acetaminophen] Hives and Rash    Denies shortness of breath    Medications:  Current Outpatient Medications:    amoxicillin-clavulanate (AUGMENTIN) 875-125 MG tablet, Take 1 tablet by mouth 2 (two) times daily., Disp: 20 tablet, Rfl: 0   ALPRAZolam (XANAX) 1 MG tablet, Take 1 mg by mouth at bedtime as needed for sleep., Disp: , Rfl:    amLODipine (NORVASC) 10 MG tablet, TAKE 1 TABLET BY MOUTH EVERY DAY, Disp: 90 tablet, Rfl: 0   fluticasone (FLONASE) 50 MCG/ACT nasal spray, Place 2 sprays into both nostrils daily., Disp: 16 g, Rfl: 6   HYDROcodone-acetaminophen (NORCO/VICODIN) 5-325 MG tablet, Take 1 tablet by mouth every 6 (six) hours as needed for moderate pain., Disp: 20 tablet, Rfl: 0   olmesartan (BENICAR) 20 MG tablet, Take 1 tablet (20 mg total) by mouth daily., Disp: 90 tablet, Rfl: 1   ondansetron (ZOFRAN) 4 MG tablet, Take 1 tablet (4 mg total) by mouth every 8 (eight) hours as needed for nausea or vomiting., Disp: 20 tablet, Rfl: 0  Observations/Objective: Patient is well-developed, well-nourished in no acute distress.  Resting comfortably  at home.  Head is normocephalic, atraumatic.  No labored breathing.  Speech is clear and coherent with logical content.  Patient is alert and oriented at baseline.    Assessment and Plan: 1. Acute non-recurrent frontal sinusitis (Primary)  - fluticasone (FLONASE) 50 MCG/ACT nasal spray; Place 2 sprays into both nostrils daily.  Dispense: 16 g; Refill: 6 - amoxicillin-clavulanate (AUGMENTIN) 875-125 MG tablet; Take 1 tablet by mouth 2 (two) times daily.  Dispense: 20 tablet; Refill: 0  Symptoms of severe sinus pressure, congestion, and green nasal discharge for 5 days. No fever. Partial response to Afrin and Nyquil. -Start Augmentin 875mg  twice daily for 7 days. -Add Flonase to help with inflammation and pressure. -Continue symptomatic treatment with Tylenol or ibuprofen as needed. -Advise to use Coricidin for over-the-counter cold/flu medication due to hypertension.  Follow Up Instructions: I  discussed the assessment and treatment plan with the patient. The patient was provided an opportunity to ask questions and all were answered. The patient agreed with the plan and demonstrated an understanding of the instructions.  A copy of instructions were sent to the patient via MyChart unless otherwise noted below.     The patient was advised to call back or seek an in-person evaluation if the symptoms worsen or if the condition fails to improve as anticipated.    Dorothy Knudsen Mayers, PA-C

## 2023-08-06 ENCOUNTER — Other Ambulatory Visit: Payer: Self-pay | Admitting: Student

## 2023-08-06 DIAGNOSIS — I10 Essential (primary) hypertension: Secondary | ICD-10-CM

## 2023-08-08 ENCOUNTER — Other Ambulatory Visit: Payer: Self-pay

## 2023-09-25 ENCOUNTER — Ambulatory Visit: Admitting: Family Medicine

## 2023-09-26 ENCOUNTER — Encounter: Payer: Self-pay | Admitting: Student

## 2023-09-26 ENCOUNTER — Other Ambulatory Visit (HOSPITAL_COMMUNITY)
Admission: RE | Admit: 2023-09-26 | Discharge: 2023-09-26 | Disposition: A | Discharge status: Home / Self Care | Source: Ambulatory Visit | Attending: Family Medicine | Admitting: Family Medicine

## 2023-09-26 ENCOUNTER — Ambulatory Visit (INDEPENDENT_AMBULATORY_CARE_PROVIDER_SITE_OTHER): Admitting: Student

## 2023-09-26 VITALS — BP 129/82 | HR 91

## 2023-09-26 DIAGNOSIS — K644 Residual hemorrhoidal skin tags: Secondary | ICD-10-CM

## 2023-09-26 DIAGNOSIS — Z113 Encounter for screening for infections with a predominantly sexual mode of transmission: Secondary | ICD-10-CM | POA: Diagnosis present

## 2023-09-26 DIAGNOSIS — I10 Essential (primary) hypertension: Secondary | ICD-10-CM

## 2023-09-26 LAB — POCT WET PREP (WET MOUNT)
Clue Cells Wet Prep Whiff POC: NEGATIVE
Trichomonas Wet Prep HPF POC: ABSENT

## 2023-09-26 MED ORDER — AMLODIPINE BESYLATE 10 MG PO TABS: 10.0000 mg | ORAL_TABLET | Freq: Every day | ORAL | 0 refills | Status: AC

## 2023-09-26 MED ORDER — OLMESARTAN MEDOXOMIL 20 MG PO TABS: 20.0000 mg | ORAL_TABLET | Freq: Every day | ORAL | 1 refills | Status: AC

## 2023-09-26 NOTE — Patient Instructions (Signed)
 It was great to see you! Thank you for allowing me to participate in your care!   Our plans for today:  - I will keep you updated on swabs - I referred you to surgery for considering removal of likely anal skin tag  Take care and seek immediate care sooner if you develop any concerns.  Genora Kidd, MD

## 2023-09-26 NOTE — Progress Notes (Signed)
    SUBJECTIVE:   CHIEF COMPLAINT / HPI:   Discussed the use of AI scribe software for clinical note transcription with the patient, who gave verbal consent to proceed.  History of Present Illness Dorothy Schwartz is a 33 year old female who presents with vaginal discharge and odor.  She has experienced these symptoms for two days, with initial pronounced discharge and odor that have since diminished. She is concerned about recurrence and requests comprehensive STD testing, including bacterial vaginosis, yeast, trichomonas, gonorrhea, chlamydia, HIV, hepatitis C, and syphilis.  She also has a skin lesion near the anal area, described as a 'skin tag' or 'anal wart', which is not painful. She wishes to have it removed for cosmetic reasons. She recalls previous conflicting information about having anal warts. A past colonoscopy did not raise concerns about the lesion.  No current pain or tenderness in the cervix or upper abdominal area.  PERTINENT  PMH / PSH: S/p tubal ligation  OBJECTIVE:   BP 129/82   Pulse 91   LMP 09/08/2023   SpO2 100%    General: NAD, pleasant, able to participate in exam Respiratory: Normal effort, no obvious respiratory distress Pelvic: VULVA: normal appearing vulva with no masses, tenderness or lesions, VAGINA: Normal appearing vagina with normal color, no lesions, with white discharge present, CERVIX: No lesions, white discharge present Rectum skin color smooth tag near rectum  Chaperone Elonda Hale CMA present for pelvic exam   ASSESSMENT/PLAN:   Assessment & Plan Screening for STD (sexually transmitted disease) Intermittent discharge and odor resolved. Differential includes bacterial vaginosis, yeast infection, trichomoniasis. Requested comprehensive STD testing. - Perform wet prep for bacterial vaginosis, yeast, trichomoniasis (negative, pt messaged) - Swab for gonorrhea and chlamydia. - Order blood tests for HIV, hepatitis C, syphilis. Anal skin  tag Anal skin tag likely benign. Differential includes anal wart or residual from old hemorrhoid. No pain or discomfort. Desires removal for cosmetic reasons. - Refer to general surgery for evaluation and potential removal.   Genora Kidd, MD [facility]

## 2023-09-27 LAB — HIV ANTIBODY (ROUTINE TESTING W REFLEX): HIV Screen 4th Generation wRfx: NONREACTIVE

## 2023-09-27 LAB — RPR: RPR Ser Ql: NONREACTIVE

## 2023-09-27 LAB — HCV INTERPRETATION

## 2023-09-27 LAB — HCV AB W REFLEX TO QUANT PCR: HCV Ab: NONREACTIVE

## 2023-09-29 LAB — CERVICOVAGINAL ANCILLARY ONLY
Chlamydia: NEGATIVE
Comment: NEGATIVE
Comment: NORMAL
Neisseria Gonorrhea: NEGATIVE

## 2023-11-04 ENCOUNTER — Encounter: Payer: Self-pay | Admitting: *Deleted

## 2023-12-02 ENCOUNTER — Ambulatory Visit

## 2023-12-02 ENCOUNTER — Telehealth: Admitting: Physician Assistant

## 2023-12-02 DIAGNOSIS — A084 Viral intestinal infection, unspecified: Secondary | ICD-10-CM

## 2023-12-02 MED ORDER — ONDANSETRON 4 MG PO TBDP: 4.0000 mg | ORAL_TABLET | Freq: Three times a day (TID) | ORAL | 0 refills | Status: DC | PRN

## 2023-12-02 NOTE — Progress Notes (Signed)
 Virtual Visit Consent   Dorothy Schwartz, you are scheduled for a virtual visit with a [facility] provider today. Just as with appointments in the office, your consent must be obtained to participate. Your consent will be active for this visit and any virtual visit you may have with one of our providers in the next 365 days. If you have a MyChart account, a copy of this consent can be sent to you electronically.  As this is a virtual visit, video technology does not allow for your provider to perform a traditional examination. This may limit your provider's ability to fully assess your condition. If your provider identifies any concerns that need to be evaluated in person or the need to arrange testing (such as labs, EKG, etc.), we will make arrangements to do so. Although advances in technology are sophisticated, we cannot ensure that it will always work on either your end or our end. If the connection with a video visit is poor, the visit may have to be switched to a telephone visit. With either a video or telephone visit, we are not always able to ensure that we have a secure connection.  By engaging in this virtual visit, you consent to the provision of healthcare and authorize for your insurance to be billed (if applicable) for the services provided during this visit. Depending on your insurance coverage, you may receive a charge related to this service.  I need to obtain your verbal consent now. Are you willing to proceed with your visit today? Dorothy Schwartz has provided verbal consent on 12/02/2023 for a virtual visit (video or telephone). Dorothy Schwartz, [place]  Date: 12/02/2023 2:04 PM   Virtual Visit via Video Note   I, Dorothy Schwartz, connected with  Dorothy Schwartz  ([id number], 07-26-1990) on 12/02/23 at  2:00 PM EDT by a video-enabled telemedicine application and verified that I am speaking with the correct person using two identifiers.  Location: Patient: Virtual Visit Location  Patient: Home Provider: Virtual Visit Location Provider: Home Office   I discussed the limitations of evaluation and management by telemedicine and the availability of in person appointments. The patient expressed understanding and agreed to proceed.    History of Present Illness: Dorothy Schwartz is a 33 y.o. who identifies as a female who was assigned female at birth, and is being seen today for mild headache, diarrhea and nausea and low-grade fever starting yesterday into today. Notes is able to hydrate well. Denies melena or hematochezia. Denies vomiting.  Son with similar symptoms. OTC -- Pepto Bismol  HPI: HPI  Problems:  Patient Active Problem List   Diagnosis Date Noted   Concussion 02/07/2023   Changing pigmented skin lesion 07/15/2022   Family history of colon cancer 10/02/2020   Abnormal cervical Papanicolaou smear 04/01/2019   Anxiety 11/03/2018   HTN (hypertension) 03/25/2017    Allergies:  Allergies  Allergen Reactions   Morphine  And Codeine Shortness Of Breath    Pt. States she can tolerate Dilaudid  & has had it before.   Percocet [Oxycodone -Acetaminophen ] Hives and Rash    Denies shortness of breath   Medications:  Current Outpatient Medications:    ondansetron  (ZOFRAN -ODT) 4 MG disintegrating tablet, Take 1 tablet (4 mg total) by mouth every 8 (eight) hours as needed for nausea or vomiting., Disp: 20 tablet, Rfl: 0   ALPRAZolam  (XANAX ) 1 MG tablet, Take 1 mg by mouth at bedtime as needed for sleep., Disp: , Rfl:  amLODipine  (NORVASC ) 10 MG tablet, Take 1 tablet (10 mg total) by mouth daily., Disp: 90 tablet, Rfl: 0   olmesartan  (BENICAR ) 20 MG tablet, Take 1 tablet (20 mg total) by mouth daily., Disp: 90 tablet, Rfl: 1  Observations/Objective: Patient is well-developed, well-nourished in no acute distress.  Resting comfortably at home.  Head is normocephalic, atraumatic.  No labored breathing. Speech is clear and coherent with logical content.  Patient is alert  and oriented at baseline.   Assessment and Plan: 1. Viral gastroenteritis (Primary) - ondansetron  (ZOFRAN -ODT) 4 MG disintegrating tablet; Take 1 tablet (4 mg total) by mouth every 8 (eight) hours as needed for nausea or vomiting.  Dispense: 20 tablet; Refill: 0  Viral. No alarm signs or symptoms. Supportive measures and OTC medications reviewed. Start [employer] Zofran  per orders. Work note provided. In person follow-up precautions reviewed with patient.   Follow Up Instructions: I discussed the assessment and treatment plan with the patient. The patient was provided an opportunity to ask questions and all were answered. The patient agreed with the plan and demonstrated an understanding of the instructions.  A copy of instructions were sent to the patient via MyChart unless otherwise noted below.   The patient was advised to call back or seek an in-person evaluation if the symptoms worsen or if the condition fails to improve as anticipated.    Dorothy Velma Lunger, PA-C

## 2023-12-02 NOTE — Progress Notes (Deleted)
    SUBJECTIVE:   CHIEF COMPLAINT / HPI:   Sore throat/cough  PERTINENT  PMH / PSH: ***  OBJECTIVE:   There were no vitals taken for this visit.  ***  ASSESSMENT/PLAN:   Assessment & Plan      Lucie Pinal, DO [facility]

## 2023-12-02 NOTE — Patient Instructions (Signed)
 [name], thank you for joining Elsie Velma Lunger, PA-C for today's virtual visit.  While this provider is not your primary care provider (PCP), if your PCP is located in our provider database this encounter information will be shared with them immediately following your visit.   A [facility] MyChart account gives you access to today's visit and all your visits, tests, and labs performed at [facility]  click here if you don't have a [facility] MyChart account or go to [email or web address]  Consent: (Patient) TALYN EDDIE provided verbal consent for this virtual visit at the beginning of the encounter.  Current Medications:  Current Outpatient Medications:    ALPRAZolam  (XANAX ) 1 MG tablet, Take 1 mg by mouth at bedtime as needed for sleep., Disp: , Rfl:    amLODipine  (NORVASC ) 10 MG tablet, Take 1 tablet (10 mg total) by mouth daily., Disp: 90 tablet, Rfl: 0   olmesartan  (BENICAR ) 20 MG tablet, Take 1 tablet (20 mg total) by mouth daily., Disp: 90 tablet, Rfl: 1   Medications ordered in this encounter:  No orders of the defined types were placed in this encounter.    *If you need refills on other medications prior to your next appointment, please contact your pharmacy*  Follow-Up: Call back or seek an in-person evaluation if the symptoms worsen or if the condition fails to improve as anticipated.   Virtual Care [phone]  Other Instructions Please hydrate and rest. Once up to eating, follow the dietary recommendations below. Use the Zofran  as directed, when needed for nausea/vomiting. If you note any non-resolving, new, or worsening symptoms despite treatment, please seek an in-person evaluation ASAP.  Food Choices to Help Relieve Diarrhea, Adult Diarrhea can make you feel weak and cause you to become dehydrated. Dehydration is a condition in which there is not enough water or other fluids in the body. It is important to choose the right  foods and drinks to: Relieve diarrhea. Replace lost fluids and nutrients. Prevent dehydration. What are tips for following this plan? Relieving diarrhea Avoid foods that make your diarrhea worse. These may include: Foods and drinks that are sweetened with high-fructose corn syrup, honey, or sweeteners such as xylitol, sorbitol, and mannitol. Check food labels for these ingredients. Fried, greasy, or spicy foods. Raw fruits and vegetables. Eat foods that are rich in probiotics. These include foods such as yogurt and fermented milk products. Probiotics can help increase healthy bacteria in your stomach and intestines (gastrointestinal or GI tract). This may help digestion and stop diarrhea. If you have lactose intolerance, avoid dairy products. These may make your diarrhea worse. Take medicine to help stop diarrhea only as told by your health care provider. Replacing nutrients  Eat bland, easy-to-digest foods in small amounts as you are able, until your diarrhea starts to get better. These foods include bananas, applesauce, rice, toast, and crackers. Over time, add nutrient-rich foods as your body tolerates them or as told by your health care provider. These include: Well-cooked protein foods, such as eggs, lean meats like fish or chicken without skin, and tofu. Peeled, seeded, and soft-cooked fruits and vegetables. Low-fat dairy products. Whole grains. Take vitamin and mineral supplements as told by your health care provider. Preventing dehydration  Start by sipping water or a solution to prevent dehydration (oral rehydration solution, or ORS). This is a drink that helps replace fluids and minerals your body has lost. You can buy an ORS at pharmacies and retail stores. Try to drink  at least 8-10 cups (2,000-2,500 mL) of fluid each day to help replace lost fluids. If your urine is pale yellow, you are getting enough fluids. You may drink other liquids in addition to water, such as fruit juice  that you have added water to (diluted fruit juice) or low-calorie sports drinks, as tolerated or as told by your health care provider. Avoid drinks with caffeine, such as coffee, tea, or soft drinks. Avoid alcohol. This information is not intended to replace advice given to you by your health care provider. Make sure you discuss any questions you have with your health care provider. Document Revised: 10/30/2021 Document Reviewed: 10/30/2021 Elsevier Patient Education  2024 Elsevier Inc.    If you have been instructed to have an in-person evaluation today at a local Urgent Care facility, please use the link below. It will take you to a list of all of our available [facility], including address, phone number and hours of operation. Please do not delay care.  [facility]  If you or a family member do not have a primary care provider, use the link below to schedule a visit and establish care. When you choose a [facility] primary care physician or advanced practice provider, you gain a long-term partner in health. Find a Primary Care Provider  Learn more about [facility]'s in-office and virtual care options: [facility] - Get Care Now

## 2024-01-28 ENCOUNTER — Telehealth: Admitting: Family Medicine

## 2024-01-28 DIAGNOSIS — J069 Acute upper respiratory infection, unspecified: Secondary | ICD-10-CM | POA: Diagnosis not present

## 2024-01-28 MED ORDER — BENZONATATE 100 MG PO CAPS: 100.0000 mg | ORAL_CAPSULE | Freq: Three times a day (TID) | ORAL | 0 refills | Status: DC | PRN

## 2024-01-28 NOTE — Progress Notes (Signed)

## 2024-01-28 NOTE — Progress Notes (Signed)
 I have spent 5 minutes in review of e-visit questionnaire, review and updating patient chart, medical decision making and response to patient.   Elsie Velma Lunger, PA-C

## 2024-02-07 ENCOUNTER — Telehealth: Admitting: Nurse Practitioner

## 2024-02-07 ENCOUNTER — Other Ambulatory Visit: Payer: Self-pay | Admitting: Family Medicine

## 2024-02-07 DIAGNOSIS — N760 Acute vaginitis: Secondary | ICD-10-CM

## 2024-02-07 DIAGNOSIS — B3731 Acute candidiasis of vulva and vagina: Secondary | ICD-10-CM

## 2024-02-07 DIAGNOSIS — B9689 Other specified bacterial agents as the cause of diseases classified elsewhere: Secondary | ICD-10-CM

## 2024-02-07 MED ORDER — METRONIDAZOLE 500 MG PO TABS: 500.0000 mg | ORAL_TABLET | Freq: Two times a day (BID) | ORAL | 0 refills | Status: AC

## 2024-02-07 MED ORDER — FLUCONAZOLE 150 MG PO TABS: 150.0000 mg | ORAL_TABLET | Freq: Once | ORAL | 0 refills | Status: AC

## 2024-02-07 NOTE — Patient Instructions (Signed)
  [name], thank you for joining Haze LELON Servant, NP for today's virtual visit.  While this provider is not your primary care provider (PCP), if your PCP is located in our provider database this encounter information will be shared with them immediately following your visit.   A [facility] MyChart account gives you access to today's visit and all your visits, tests, and labs performed at [facility]  click here if you don't have a [facility] MyChart account or go to [email or web address]  Consent: (Patient) Dorothy Schwartz provided verbal consent for this virtual visit at the beginning of the encounter.  Current Medications:  Current Outpatient Medications:    fluconazole  (DIFLUCAN ) 150 MG tablet, Take 1 tablet (150 mg total) by mouth once for 1 dose., Disp: 1 tablet, Rfl: 0   metroNIDAZOLE  (FLAGYL ) 500 MG tablet, Take 1 tablet (500 mg total) by mouth 2 (two) times daily for 7 days., Disp: 14 tablet, Rfl: 0   ALPRAZolam  (XANAX ) 1 MG tablet, Take 1 mg by mouth at bedtime as needed for sleep., Disp: , Rfl:    amLODipine  (NORVASC ) 10 MG tablet, Take 1 tablet (10 mg total) by mouth daily., Disp: 90 tablet, Rfl: 0   benzonatate  (TESSALON ) 100 MG capsule, Take 1 capsule (100 mg total) by mouth 3 (three) times daily as needed for cough., Disp: 30 capsule, Rfl: 0   olmesartan  (BENICAR ) 20 MG tablet, Take 1 tablet (20 mg total) by mouth daily., Disp: 90 tablet, Rfl: 1   Medications ordered in this encounter:  Meds ordered this encounter  Medications   metroNIDAZOLE  (FLAGYL ) 500 MG tablet    Sig: Take 1 tablet (500 mg total) by mouth 2 (two) times daily for 7 days.    Dispense:  14 tablet    Refill:  0    Supervising Provider:   BLAISE ALEENE KIDD [[id number]]   fluconazole  (DIFLUCAN ) 150 MG tablet    Sig: Take 1 tablet (150 mg total) by mouth once for 1 dose.    Dispense:  1 tablet    Refill:  0    Supervising Provider:   LAMPTEY, PHILIP O [[id number]]     *If you need refills  on other medications prior to your next appointment, please contact your pharmacy*  Follow-Up: Call back or seek an in-person evaluation if the symptoms worsen or if the condition fails to improve as anticipated.  [facility] [phone]    If you have been instructed to have an in-person evaluation today at a local Urgent Care facility, please use the link below. It will take you to a list of all of our available [facility], including address, phone number and hours of operation. Please do not delay care.  [facility]  If you or a family member do not have a primary care provider, use the link below to schedule a visit and establish care. When you choose a [facility] primary care physician or advanced practice provider, you gain a long-term partner in health. Find a Primary Care Provider  Learn more about [facility]'s in-office and virtual care options: [facility] - Get Care Now

## 2024-02-07 NOTE — Progress Notes (Signed)
 Virtual Visit Consent   Dorothy Schwartz, you are scheduled for a virtual visit with a [facility] provider today. Just as with appointments in the office, your consent must be obtained to participate. Your consent will be active for this visit and any virtual visit you may have with one of our providers in the next 365 days. If you have a MyChart account, a copy of this consent can be sent to you electronically.  As this is a virtual visit, video technology does not allow for your provider to perform a traditional examination. This may limit your provider's ability to fully assess your condition. If your provider identifies any concerns that need to be evaluated in person or the need to arrange testing (such as labs, EKG, etc.), we will make arrangements to do so. Although advances in technology are sophisticated, we cannot ensure that it will always work on either your end or our end. If the connection with a video visit is poor, the visit may have to be switched to a telephone visit. With either a video or telephone visit, we are not always able to ensure that we have a secure connection.  By engaging in this virtual visit, you consent to the provision of healthcare and authorize for your insurance to be billed (if applicable) for the services provided during this visit. Depending on your insurance coverage, you may receive a charge related to this service.  I need to obtain your verbal consent now. Are you willing to proceed with your visit today? Dorothy Schwartz has provided verbal consent on 02/07/2024 for a virtual visit (video or telephone). Haze LELON Servant, NP  Date: 02/07/2024 4:10 PM   Virtual Visit via Video Note   I, Haze LELON Servant, connected with  Dorothy Schwartz  ([id number], [birth year]) on 02/07/24 at  4:00 PM EDT by a video-enabled telemedicine application and verified that I am speaking with the correct person using two identifiers.  Location: Patient: Virtual Visit Location Patient:  Home Provider: Virtual Visit Location Provider: Home Office   I discussed the limitations of evaluation and management by telemedicine and the availability of in person appointments. The patient expressed understanding and agreed to proceed.    History of Present Illness: Dorothy Schwartz is a 33 y.o. who identifies as a female who was assigned female at birth, and is being seen today for vaginal irritation.  Ms. Cancel has a history of recurrent BV and has run out of her standing prescription of metronidazole . She is also experiencing swelling and itching in the vaginal area.  Problems:  Patient Active Problem List   Diagnosis Date Noted   Concussion 02/07/2023   Changing pigmented skin lesion 07/15/2022   Family history of colon cancer 10/02/2020   Abnormal cervical Papanicolaou smear 04/01/2019   Anxiety 11/03/2018   HTN (hypertension) 03/25/2017    Allergies:  Allergies  Allergen Reactions   Morphine  And Codeine Shortness Of Breath    Pt. States she can tolerate Dilaudid  & has had it before.   Percocet [Oxycodone -Acetaminophen ] Hives and Rash    Denies shortness of breath   Medications:  Current Outpatient Medications:    fluconazole  (DIFLUCAN ) 150 MG tablet, Take 1 tablet (150 mg total) by mouth once for 1 dose., Disp: 1 tablet, Rfl: 0   metroNIDAZOLE  (FLAGYL ) 500 MG tablet, Take 1 tablet (500 mg total) by mouth 2 (two) times daily for 7 days., Disp: 14 tablet, Rfl: 0   ALPRAZolam  (XANAX ) 1 MG tablet, Take  1 mg by mouth at bedtime as needed for sleep., Disp: , Rfl:    amLODipine  (NORVASC ) 10 MG tablet, Take 1 tablet (10 mg total) by mouth daily., Disp: 90 tablet, Rfl: 0   benzonatate  (TESSALON ) 100 MG capsule, Take 1 capsule (100 mg total) by mouth 3 (three) times daily as needed for cough., Disp: 30 capsule, Rfl: 0   olmesartan  (BENICAR ) 20 MG tablet, Take 1 tablet (20 mg total) by mouth daily., Disp: 90 tablet, Rfl: 1  Observations/Objective: Patient is well-developed,  well-nourished in no acute distress.  Resting comfortably at home.  Head is normocephalic, atraumatic.  No labored breathing. Speech is clear and coherent with logical content.  Patient is alert and oriented at baseline.    Assessment and Plan: 1. BV (bacterial vaginosis) (Primary) - metroNIDAZOLE  (FLAGYL ) 500 MG tablet; Take 1 tablet (500 mg total) by mouth 2 (two) times daily for 7 days.  Dispense: 14 tablet; Refill: 0  2. Yeast vaginitis - fluconazole  (DIFLUCAN ) 150 MG tablet; Take 1 tablet (150 mg total) by mouth once for 1 dose.  Dispense: 1 tablet; Refill: 0    Follow Up Instructions: I discussed the assessment and treatment plan with the patient. The patient was provided an opportunity to ask questions and all were answered. The patient agreed with the plan and demonstrated an understanding of the instructions.  A copy of instructions were sent to the patient via MyChart unless otherwise noted below.    The patient was advised to call back or seek an in-person evaluation if the symptoms worsen or if the condition fails to improve as anticipated.    Anastassia Noack W Hanifah Royse, NP

## 2024-02-21 ENCOUNTER — Telehealth: Admitting: Family Medicine

## 2024-02-21 DIAGNOSIS — R110 Nausea: Secondary | ICD-10-CM

## 2024-02-21 MED ORDER — ONDANSETRON HCL 4 MG PO TABS: 4.0000 mg | ORAL_TABLET | Freq: Three times a day (TID) | ORAL | 0 refills | Status: AC | PRN

## 2024-02-21 NOTE — Progress Notes (Signed)

## 2024-02-21 NOTE — Progress Notes (Signed)
Pt requesting work note

## 2024-04-13 ENCOUNTER — Telehealth: Admitting: Physician Assistant

## 2024-04-13 DIAGNOSIS — B9789 Other viral agents as the cause of diseases classified elsewhere: Secondary | ICD-10-CM

## 2024-04-13 DIAGNOSIS — J329 Chronic sinusitis, unspecified: Secondary | ICD-10-CM

## 2024-04-13 MED ORDER — FLUTICASONE PROPIONATE 50 MCG/ACT NA SUSP: 2.0000 | Freq: Every day | NASAL | 0 refills | Status: AC

## 2024-04-13 MED ORDER — ONDANSETRON 4 MG PO TBDP: 4.0000 mg | ORAL_TABLET | Freq: Three times a day (TID) | ORAL | 0 refills | Status: AC | PRN

## 2024-04-13 NOTE — Progress Notes (Signed)
 Virtual Visit Consent   Dorothy Schwartz, you are scheduled for a virtual visit with a [facility] provider today. Just as with appointments in the office, your consent must be obtained to participate. Your consent will be active for this visit and any virtual visit you may have with one of our providers in the next 365 days. If you have a MyChart account, a copy of this consent can be sent to you electronically.  As this is a virtual visit, video technology does not allow for your provider to perform a traditional examination. This may limit your provider's ability to fully assess your condition. If your provider identifies any concerns that need to be evaluated in person or the need to arrange testing (such as labs, EKG, etc.), we will make arrangements to do so. Although advances in technology are sophisticated, we cannot ensure that it will always work on either your end or our end. If the connection with a video visit is poor, the visit may have to be switched to a telephone visit. With either a video or telephone visit, we are not always able to ensure that we have a secure connection.  By engaging in this virtual visit, you consent to the provision of healthcare and authorize for your insurance to be billed (if applicable) for the services provided during this visit. Depending on your insurance coverage, you may receive a charge related to this service.  I need to obtain your verbal consent now. Are you willing to proceed with your visit today? Dorothy Schwartz has provided verbal consent on 04/13/2024 for a virtual visit (video or telephone). Dorothy Schwartz, [place]  Date: 04/13/2024 11:22 AM   Virtual Visit via Video Note   I, Dorothy Schwartz, connected with  Dorothy Schwartz  ([id number], Nov 02, 1990) on 04/13/24 at 11:15 AM EST by a video-enabled telemedicine application and verified that I am speaking with the correct person using two identifiers.  Location: Patient: Virtual Visit Location  Patient: Home Provider: Virtual Visit Location Provider: Home Office   I discussed the limitations of evaluation and management by telemedicine and the availability of in person appointments. The patient expressed understanding and agreed to proceed.    History of Present Illness: Dorothy Schwartz is a 33 y.o. who identifies as a female who was assigned female at birth, and is being seen today for 2 days of nasal congestion with frontal sinus pressure and post-nasal drip causing cough and mild nausea. Some decreased appetite. Denies fever, chills. Denies recent travel. 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 Has not taken a home COVID test. Has taken OTc Mucinex and used Afrin nasal spray.    HPI: HPI  Problems:  Patient Active Problem List   Diagnosis Date Noted   Concussion 02/07/2023   Changing pigmented skin lesion 07/15/2022   Family history of colon cancer 10/02/2020   Abnormal cervical Papanicolaou smear 04/01/2019   Anxiety 11/03/2018   HTN (hypertension) 03/25/2017    Allergies:  Allergies  Allergen Reactions   Morphine  And Codeine Shortness Of Breath    Pt. States she can tolerate Dilaudid  & has had it before.   Percocet [Oxycodone -Acetaminophen ] Hives and Rash    Denies shortness of breath   Medications:  Current Outpatient Medications:    fluticasone  (FLONASE ) 50 MCG/ACT nasal spray, Place 2 sprays into both nostrils daily., Disp: 16 g, Rfl: 0   ondansetron  (ZOFRAN -ODT) 4 MG disintegrating tablet, Take 1 tablet (4 mg total) by mouth every 8 (  eight) hours as needed for nausea or vomiting., Disp: 20 tablet, Rfl: 0   ALPRAZolam  (XANAX ) 1 MG tablet, Take 1 mg by mouth at bedtime as needed for sleep., Disp: , Rfl:    amLODipine  (NORVASC ) 10 MG tablet, Take 1 tablet (10 mg total) by mouth daily., Disp: 90 tablet, Rfl: 0   olmesartan  (BENICAR ) 20 MG tablet, Take 1 tablet (20 mg total) by mouth daily., Disp: 90 tablet, Rfl: 1  Observations/Objective: Patient is  well-developed, well-nourished in no acute distress.  Resting comfortably  at home.  Head is normocephalic, atraumatic.  No labored breathing.  Speech is clear and coherent with logical content.  Patient is alert and oriented at baseline.    Assessment and Plan: 1. Viral sinusitis (Primary) - ondansetron  (ZOFRAN -ODT) 4 MG disintegrating tablet; Take 1 tablet (4 mg total) by mouth every 8 (eight) hours as needed for nausea or vomiting.  Dispense: 20 tablet; Refill: 0 - fluticasone  (FLONASE ) 50 MCG/ACT nasal spray; Place 2 sprays into both nostrils daily.  Dispense: 16 g; Refill: 0   Will have her COVID test as a precaution.She is to notify me ASAP of results. Increase fluids and rest. Stop Afrin. Start Fluticasone  spray. Zofran  per orders. Ok to continue Mucinex. Will alter Tx based on COVID results.   Follow Up Instructions: I discussed the assessment and treatment plan with the patient. The patient was provided an opportunity to ask questions and all were answered. The patient agreed with the plan and demonstrated an understanding of the instructions.  A copy of instructions were sent to the patient via MyChart unless otherwise noted below.   The patient was advised to call back or seek an in-person evaluation if the symptoms worsen or if the condition fails to improve as anticipated.    Dorothy Velma Lunger, PA-C

## 2024-04-13 NOTE — Patient Instructions (Signed)
 [name], thank you for joining Elsie Velma Lunger, PA-C for today's virtual visit.  While this provider is not your primary care provider (PCP), if your PCP is located in our provider database this encounter information will be shared with them immediately following your visit.   A [facility] MyChart account gives you access to today's visit and all your visits, tests, and labs performed at [facility]  click here if you don't have a [facility] MyChart account or go to [email or web address]  Consent: (Patient) Dorothy Schwartz provided verbal consent for this virtual visit at the beginning of the encounter.  Current Medications:  Current Outpatient Medications:    ALPRAZolam  (XANAX ) 1 MG tablet, Take 1 mg by mouth at bedtime as needed for sleep., Disp: , Rfl:    amLODipine  (NORVASC ) 10 MG tablet, Take 1 tablet (10 mg total) by mouth daily., Disp: 90 tablet, Rfl: 0   benzonatate  (TESSALON ) 100 MG capsule, Take 1 capsule (100 mg total) by mouth 3 (three) times daily as needed for cough., Disp: 30 capsule, Rfl: 0   olmesartan  (BENICAR ) 20 MG tablet, Take 1 tablet (20 mg total) by mouth daily., Disp: 90 tablet, Rfl: 1   Medications ordered in this encounter:  No orders of the defined types were placed in this encounter.    *If you need refills on other medications prior to your next appointment, please contact your pharmacy*  Follow-Up: Call back or seek an in-person evaluation if the symptoms worsen or if the condition fails to improve as anticipated.  [facility] [phone]  Other Instructions  Take the home COVID test and message me the results ASAP.  Upper Respiratory Infection, Adult An upper respiratory infection (URI) affects the nose, throat, and upper airways that lead to the lungs. The most common type of URI is often called the common cold. URIs usually get better on their own, without medical treatment. What are the causes? A URI is  caused by a germ (virus). You may catch these germs by: Breathing in droplets from an infected person's cough or sneeze. Touching something that has the germ on it (is contaminated) and then touching your mouth, nose, or eyes. What increases the risk? You are more likely to get a URI if: You are very young or very old. You have close contact with others, such as at work, school, or a health care facility. You smoke. You have long-term (chronic) heart or lung disease. You have a weakened disease-fighting system (immune system). You have nasal allergies or asthma. You have a lot of stress. You have poor nutrition. What are the signs or symptoms? Runny or stuffy (congested) nose. Cough. Sneezing. Sore throat. Headache. Feeling tired (fatigue). Fever. Not wanting to eat as much as usual. Pain in your forehead, behind your eyes, and over your cheekbones (sinus pain). Muscle aches. Redness or irritation of the eyes. Pressure in the ears or face. How is this treated? URIs usually get better on their own within 7-10 days. Medicines cannot cure URIs, but your doctor may recommend certain medicines to help relieve symptoms, such as: Over-the-counter cold medicines. Medicines to reduce coughing (cough suppressants). Coughing is a type of defense against infection that helps to clear the nose, throat, windpipe, and lungs (respiratory system). Take these medicines only as told by your doctor. Medicines to lower your fever. Follow these instructions at home: Activity Rest as needed. If you have a fever, stay home from work or school until your  fever is gone, or until your doctor says you may return to work or school. You should stay home until you cannot spread the infection anymore (you are not contagious). Your doctor may have you wear a face mask so you have less risk of spreading the infection. Relieving symptoms Rinse your mouth often with salt water. To make salt water, dissolve -1 tsp  (3-6 g) of salt in 1 cup (237 mL) of warm water. Use a cool-mist humidifier to add moisture to the air. This can help you breathe more easily. Eating and drinking  Drink enough fluid to keep your pee (urine) pale yellow. Eat soups and other clear broths. General instructions  Take over-the-counter and prescription medicines only as told by your doctor. Do not smoke or use any products that contain nicotine or tobacco. If you need help quitting, ask your doctor. Avoid being where people are smoking (avoid secondhand smoke). Stay up to date on all your shots (immunizations), and get the flu shot every year. Keep all follow-up visits. How to prevent the spread of infection to others  Wash your hands with soap and water for at least 20 seconds. If you cannot use soap and water, use hand sanitizer. Avoid touching your mouth, face, eyes, or nose. Cough or sneeze into a tissue or your sleeve or elbow. Do not cough or sneeze into your hand or into the air. Contact a doctor if: You are getting worse, not better. You have any of these: A fever or chills. Brown or red mucus in your nose. Yellow or brown fluid (discharge)coming from your nose. Pain in your face, especially when you bend forward. Swollen neck glands. Pain when you swallow. White areas in the back of your throat. Get help right away if: You have shortness of breath that gets worse. You have very bad or constant: Headache. Ear pain. Pain in your forehead, behind your eyes, and over your cheekbones (sinus pain). Chest pain. You have long-lasting (chronic) lung disease along with any of these: Making high-pitched whistling sounds when you breathe, most often when you breathe out (wheezing). Long-lasting cough (more than 14 days). Coughing up blood. A change in your usual mucus. You have a stiff neck. You have changes in your: Vision. Hearing. Thinking. Mood. These symptoms may be an emergency. Get help right away. Call  911. Do not wait to see if the symptoms will go away. Do not drive yourself to the hospital. Summary An upper respiratory infection (URI) is caused by a germ (virus). The most common type of URI is often called the common cold. URIs usually get better within 7-10 days. Take over-the-counter and prescription medicines only as told by your doctor. This information is not intended to replace advice given to you by your health care provider. Make sure you discuss any questions you have with your health care provider. Document Revised: 12/13/2020 Document Reviewed: 12/13/2020 Elsevier Patient Education  2024 Elsevier Inc.   If you have been instructed to have an in-person evaluation today at a local Urgent Care facility, please use the link below. It will take you to a list of all of our available [facility], including address, phone number and hours of operation. Please do not delay care.  [facility]  If you or a family member do not have a primary care provider, use the link below to schedule a visit and establish care. When you choose a [facility] primary care physician or advanced practice provider,  you gain a long-term partner in health. Find a Primary Care Provider  Learn more about [facility]'s in-office and virtual care options: [facility] - Get Care Now

## 2024-04-19 ENCOUNTER — Telehealth

## 2024-06-08 ENCOUNTER — Ambulatory Visit: Admitting: Family Medicine

## 2024-06-08 VITALS — BP 133/88 | HR 88 | Ht 63.0 in | Wt 185.2 lb

## 2024-06-08 DIAGNOSIS — G932 Benign intracranial hypertension: Secondary | ICD-10-CM | POA: Diagnosis not present

## 2024-06-08 MED ORDER — NAPROXEN 500 MG PO TABS: 500.0000 mg | ORAL_TABLET | Freq: Two times a day (BID) | ORAL | 0 refills | Status: AC

## 2024-06-08 NOTE — Patient Instructions (Signed)
 It was wonderful to see you today!  There are 2 possibilities for what is causing your headaches at this time.  The first is that you have idiopathic intracranial hypertension or IIH.  This is a condition where your body produces too much cerebrospinal fluid which causes pressure to build up inside your head causing headaches as well as changes in the eye that can lead to vision problems.  I have placed an urgent referral for you to be seen by ophthalmology to have your eyes looked at and look for any changes associated with IIH.  In the meantime we will give you treatment for the other condition which is chronic tension headaches.  These are common in people who work high stress jobs and are more musculoskeletal in their etiology.  Please take Naprosyn  500 mg twice daily for 5 days and then as needed.  I will have you follow-up in the office in 2 weeks to discuss this treatment and see if anything has changed.   Please call [phone] with any questions about today's appointment.   If you need any additional refills, please call your pharmacy before calling the office.  Lucie Pinal, DO Family Medicine

## 2024-06-08 NOTE — Progress Notes (Signed)
" ° ° °  SUBJECTIVE:   CHIEF COMPLAINT / HPI:   Chronic Headaches - present for years - feels like a pressure in the back of her head and in her neck - constantly present with flares that last for 20 minutes to an hour - triggered by position changes, especially if she lays on her stomach or leans forward.  - has noticed floaters in her vision, especially on the left - reports a family history of IIH  PERTINENT  PMH / PSH: Htn  OBJECTIVE:   BP 133/88   Pulse 88   Ht 5' 3 (1.6 m)   Wt 185 lb 3.2 oz (84 kg)   SpO2 99%   BMI 32.81 kg/m   General: A&O, NAD Cardiac: RRR, no m/r/g Respiratory: CTAB, normal WOB, no w/c/r Neuro:  CN 2-12 intact, no changes in sensation or strength to the bilateral limbs. 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 Chronic tension type headache - urgent referral to ophthalmology for evaluation of optic nerves/papilledema - trial of naprosyn  BID, counseled not to use other NSAIDs in conjuction - follow up in 2 weeks, next steps if eye exam positive would be to perform LP   Dorothy Pinal, DO [facility] "

## 2024-06-25 ENCOUNTER — Ambulatory Visit: Payer: Self-pay | Admitting: Student

## 2024-06-25 ENCOUNTER — Encounter: Payer: Self-pay | Admitting: Student

## 2024-06-25 VITALS — BP 133/89 | HR 79 | Ht 63.0 in | Wt 183.8 lb

## 2024-06-25 DIAGNOSIS — F419 Anxiety disorder, unspecified: Secondary | ICD-10-CM | POA: Diagnosis not present

## 2024-06-25 DIAGNOSIS — I10 Essential (primary) hypertension: Secondary | ICD-10-CM

## 2024-06-25 DIAGNOSIS — G932 Benign intracranial hypertension: Secondary | ICD-10-CM | POA: Diagnosis not present

## 2024-06-25 MED ORDER — ALPRAZOLAM 0.25 MG PO TABS: 0.2500 mg | ORAL_TABLET | Freq: Every evening | ORAL | 0 refills | Status: AC | PRN

## 2024-06-25 MED ORDER — HYDROXYZINE HCL 10 MG PO TABS: 10.0000 mg | ORAL_TABLET | Freq: Three times a day (TID) | ORAL | 0 refills | Status: AC | PRN

## 2024-06-25 MED ORDER — ESCITALOPRAM OXALATE 10 MG PO TABS: 10.0000 mg | ORAL_TABLET | Freq: Every day | ORAL | 1 refills | Status: AC

## 2024-06-25 NOTE — Patient Instructions (Addendum)
 Referral Sent to: [facility] [address] [place], [place] [address] [phone]  - Please break your current 14 (1mg ) tablets in half so they are 0.5 mg each. This will give you 28 0.5 mg tablets.  - For the next 2 weeks, please take ONE 0.5 mg tablet with ONE 0.25 mg tablet I have prescribed - Then start taking just your 0.5 mg tablet ONCE at night for 2 weeks - After this you will follow-up with me on 07/12/2024, and we will taper further - Take 10 mg of Lexapro  every morning - Additionally, if you develop a panic attack you can take a hydroxyzine  tablet up to 3 times a day. Please know it can make you sleepy

## 2024-06-25 NOTE — Assessment & Plan Note (Signed)
-   Adherent to medications, well-controlled today

## 2024-06-25 NOTE — Assessment & Plan Note (Signed)
-  Inappropriate use of nonprescribed benzodiazepines, patient motivated to discontinue. - Taper plan discussed, see AVS - Will prescribe Xanax  temporarily to assist with tapering, will not provide refills without in person appointments.  Will not increase doses, and patient understands we will continue to taper. - Counseled extensively on withdrawal side effects, particularly seizures and instructed if she develops seizure to take her usual benzodiazepine dose and go to emergency room - Additionally will prescribe Lexapro  10 mg to treat underlying anxiety, which is the primary reason for the development of the benzodiazepine use - Discussed side effects of Lexapro  - Discussed hydroxyzine  as PRN medication for panic, prescribed short course -Will follow-up in 2 weeks to follow-up on tapering

## 2024-06-25 NOTE — Progress Notes (Signed)
" ° ° °  SUBJECTIVE:   CHIEF COMPLAINT / HPI:   Discussed the use of AI scribe software for clinical note transcription with the patient, who gave verbal consent to proceed.  History of Present Illness Dorothy Schwartz is a 34 year old female with anxiety and insomnia who presents with persistent headaches and sleep disturbances.  Headache and visual disturbances - Constant, non-debilitating headaches - Increased floaters in the left eye  Sleep disturbances - Difficulty falling and staying asleep - On work nights: bedtime at 8:30 PM, wake time at 4 AM, does not feel tired at bedtime - On non-work nights: stays up until 2-3 AM, sleeps intermittently until 10 AM, approximately seven hours total sleep - Feels tired all the time and wakes easily - No history of sleep study - No loud snoring or known symptoms of sleep apnea  Anxiety and panic symptoms - Significant anxiety with frequent anxiety attacks (approximately three times per week) - Symptoms during attacks include high heart rate, shakiness, and blurred vision - Persistent feeling of being in 'fight or flight' state - Uses nonprescribed Xanax  1 mg at night for sleep and occasionally half a tablet during the day for anxiety - Has taken Xanax  since adolescence except during pregnancies - Xanax  no longer effective for sleep - Previously trialed Zoloft  but did not tolerate  Hypertension - Takes amlodipine  and losartan daily for blood pressure control  Substance use - Drinks alcohol occasionally - No use of marijuana, methamphetamine, cocaine, or other drugs  STOP-BANG Do you snore loudly: No Do you often feel fatigued or tired or sleepy during the day time: Yes Has anyone observed you stop breathing at night: No Do you have high blood pressure: Yes BMI (>35 +1): No AGE (>50 +1): No Neck circumference (>40 +1): No Gender (Female +1): No  OBJECTIVE:   BP 133/89   Pulse 79   Ht 5' 3 (1.6 m)   Wt 183 lb 12.8 oz (83.4 kg)    LMP 04/30/2024   SpO2 98%   BMI 32.56 kg/m    ASSESSMENT/PLAN:   Assessment & Plan Anxiety -Inappropriate use of nonprescribed benzodiazepines, patient motivated to discontinue. - Taper plan discussed, see AVS - Will prescribe Xanax  temporarily to assist with tapering, will not provide refills without in person appointments.  Will not increase doses, and patient understands we will continue to taper. - Counseled extensively on withdrawal side effects, particularly seizures and instructed if she develops seizure to take her usual benzodiazepine dose and go to emergency room - Additionally will prescribe Lexapro  10 mg to treat underlying anxiety, which is the primary reason for the development of the benzodiazepine use - Discussed side effects of Lexapro  - Discussed hydroxyzine  as PRN medication for panic, prescribed short course -Will follow-up in 2 weeks to follow-up on tapering  Hypertension, unspecified type - Adherent to medications, well-controlled today IIH (idiopathic intracranial hypertension) - Pending evaluation from ophthalmology - Differential includes chronic tension type headache - Phone number for ophthalmology provided, patient will call to schedule    [name], DO [facility]  "

## 2024-07-12 ENCOUNTER — Ambulatory Visit: Payer: Self-pay | Admitting: Student
